# Patient Record
Sex: Female | Born: 1935 | Race: Black or African American | Hispanic: No | Smoking: Never smoker
Health system: Southern US, Community
[De-identification: ages and names within clinical notes are randomized; demographics above are authoritative.]

## PROBLEM LIST (undated history)

## (undated) DIAGNOSIS — E119 Type 2 diabetes mellitus without complications: Secondary | ICD-10-CM

## (undated) DIAGNOSIS — F0390 Unspecified dementia without behavioral disturbance: Secondary | ICD-10-CM

## (undated) DIAGNOSIS — N289 Disorder of kidney and ureter, unspecified: Secondary | ICD-10-CM

## (undated) DIAGNOSIS — I10 Essential (primary) hypertension: Secondary | ICD-10-CM

## (undated) DIAGNOSIS — M353 Polymyalgia rheumatica: Secondary | ICD-10-CM

## (undated) DIAGNOSIS — M1990 Unspecified osteoarthritis, unspecified site: Secondary | ICD-10-CM

## (undated) HISTORY — PX: KNEE SURGERY: SHX244

## (undated) HISTORY — PX: ABDOMINAL HYSTERECTOMY: SHX81

## (undated) HISTORY — PX: ROTATOR CUFF REPAIR: SHX139

---

## 2020-11-28 ENCOUNTER — Emergency Department (HOSPITAL_BASED_OUTPATIENT_CLINIC_OR_DEPARTMENT_OTHER): Payer: Medicare Other

## 2020-11-28 ENCOUNTER — Emergency Department (HOSPITAL_BASED_OUTPATIENT_CLINIC_OR_DEPARTMENT_OTHER)
Admission: EM | Admit: 2020-11-28 | Discharge: 2020-11-28 | Disposition: A | Discharge status: Home / Self Care | Payer: Medicare Other | Attending: Emergency Medicine | Admitting: Emergency Medicine

## 2020-11-28 ENCOUNTER — Other Ambulatory Visit: Payer: Self-pay

## 2020-11-28 ENCOUNTER — Encounter (HOSPITAL_BASED_OUTPATIENT_CLINIC_OR_DEPARTMENT_OTHER): Payer: Self-pay | Admitting: *Deleted

## 2020-11-28 DIAGNOSIS — I10 Essential (primary) hypertension: Secondary | ICD-10-CM | POA: Diagnosis not present

## 2020-11-28 DIAGNOSIS — Z79899 Other long term (current) drug therapy: Secondary | ICD-10-CM | POA: Insufficient documentation

## 2020-11-28 DIAGNOSIS — R4182 Altered mental status, unspecified: Secondary | ICD-10-CM | POA: Diagnosis present

## 2020-11-28 DIAGNOSIS — E11649 Type 2 diabetes mellitus with hypoglycemia without coma: Secondary | ICD-10-CM | POA: Insufficient documentation

## 2020-11-28 DIAGNOSIS — Z7982 Long term (current) use of aspirin: Secondary | ICD-10-CM | POA: Diagnosis not present

## 2020-11-28 DIAGNOSIS — R410 Disorientation, unspecified: Secondary | ICD-10-CM | POA: Diagnosis not present

## 2020-11-28 DIAGNOSIS — E162 Hypoglycemia, unspecified: Secondary | ICD-10-CM

## 2020-11-28 HISTORY — DX: Unspecified osteoarthritis, unspecified site: M19.90

## 2020-11-28 HISTORY — DX: Type 2 diabetes mellitus without complications: E11.9

## 2020-11-28 HISTORY — DX: Essential (primary) hypertension: I10

## 2020-11-28 LAB — CBC WITH DIFFERENTIAL/PLATELET
Abs Immature Granulocytes: 0.03 10*3/uL (ref 0.00–0.07)
Basophils Absolute: 0 10*3/uL (ref 0.0–0.1)
Basophils Relative: 0 %
Eosinophils Absolute: 0 10*3/uL (ref 0.0–0.5)
Eosinophils Relative: 0 %
HCT: 36.2 % (ref 36.0–46.0)
Hemoglobin: 11.7 g/dL — ABNORMAL LOW (ref 12.0–15.0)
Immature Granulocytes: 0 %
Lymphocytes Relative: 19 %
Lymphs Abs: 1.8 10*3/uL (ref 0.7–4.0)
MCH: 28.6 pg (ref 26.0–34.0)
MCHC: 32.3 g/dL (ref 30.0–36.0)
MCV: 88.5 fL (ref 80.0–100.0)
Monocytes Absolute: 0.6 10*3/uL (ref 0.1–1.0)
Monocytes Relative: 6 %
Neutro Abs: 6.9 10*3/uL (ref 1.7–7.7)
Neutrophils Relative %: 75 %
Platelets: 209 10*3/uL (ref 150–400)
RBC: 4.09 MIL/uL (ref 3.87–5.11)
RDW: 14.3 % (ref 11.5–15.5)
WBC: 9.5 10*3/uL (ref 4.0–10.5)
nRBC: 0 % (ref 0.0–0.2)

## 2020-11-28 LAB — COMPREHENSIVE METABOLIC PANEL
ALT: 23 U/L (ref 0–44)
AST: 25 U/L (ref 15–41)
Albumin: 4.4 g/dL (ref 3.5–5.0)
Alkaline Phosphatase: 47 U/L (ref 38–126)
Anion gap: 11 (ref 5–15)
BUN: 24 mg/dL — ABNORMAL HIGH (ref 8–23)
CO2: 28 mmol/L (ref 22–32)
Calcium: 9.4 mg/dL (ref 8.9–10.3)
Chloride: 101 mmol/L (ref 98–111)
Creatinine, Ser: 0.98 mg/dL (ref 0.44–1.00)
GFR, Estimated: 57 mL/min — ABNORMAL LOW (ref >60)
Glucose, Bld: 104 mg/dL — ABNORMAL HIGH (ref 70–99)
Potassium: 3.7 mmol/L (ref 3.5–5.1)
Sodium: 140 mmol/L (ref 135–145)
Total Bilirubin: 0.6 mg/dL (ref 0.3–1.2)
Total Protein: 6.9 g/dL (ref 6.5–8.1)

## 2020-11-28 LAB — ETHANOL: Alcohol, Ethyl (B): <10 mg/dL (ref <10)

## 2020-11-28 LAB — CBG MONITORING, ED: Glucose-Capillary: 64 mg/dL — ABNORMAL LOW (ref 70–99)

## 2020-11-28 NOTE — ED Triage Notes (Signed)
Confusion last night. This am she was back to normal. She is ambulatory. Alert and oriented on arrival to the ER.

## 2020-11-28 NOTE — ED Notes (Signed)
Patient transported to CT 

## 2020-11-28 NOTE — Discharge Instructions (Addendum)
You were seen in the emergency department for evaluation of an episode of confusion yesterday.  You were having no symptoms today and your lab work EKG chest x-ray and CAT scan of your head did not show any significant findings.  Your blood sugar was mildly low and improved with sugar.  Please try to eat 3 regular meals a day and keep well-hydrated.  Return to the emergency department for any worsening or concerning symptoms.  Follow-up with your primary care doctor.

## 2020-11-28 NOTE — ED Notes (Signed)
CBG 66 Butler MD at bedside and aware advises to give patient something to eat and drink patient provided with graham crackers and juice.

## 2020-11-28 NOTE — ED Provider Notes (Signed)
[facility] EMERGENCY DEPARTMENT Provider Note   CSN: [id number] Arrival date & time: 11/28/20  1131     History Chief Complaint  Patient presents with  . Altered Mental Status    Breanna Robinson is a 87 y.o. female.  She has a history of diabetes diet-controlled and hypertension.  Lives with daughter.  Daughter said last evening when she came home from work she thought her mother was confused.  She was reading the paper and talking about things that were not in the paper.  Daughter states some of it was gibberish.  There is no weakness or unsteadiness.  Daughter confronted her with this and it made the patient angry.  Today the patient is back to baseline.  They went to urgent care where they checked a urine and referred her here for further evaluation.  Patient denies any complaints and does not think that anything unusual happened yesterday.  No headache blurry vision double vision numbness weakness chest pain shortness of breath abdominal pain vomiting diarrhea or urinary symptoms.  No recent falls.  Daughter states she takes a lot of supplements medications but does not have any new prescription meds.  The history is provided by the patient and a relative.  Altered Mental Status Presenting symptoms: confusion   Severity:  Unable to specify Most recent episode:  Yesterday Episode history:  Single Progression:  Resolved Chronicity:  New Context: not head injury and not recent change in medication   Associated symptoms: no abdominal pain, normal movement, no difficulty breathing, no fever, no headaches, no nausea, no rash, no seizures, no slurred speech, no visual change and no vomiting        Past Medical History:  Diagnosis Date  . Arthritis   . Diabetes mellitus without complication (HCC)   . Hypertension     There are no problems to display for this patient.   Past Surgical History:  Procedure Laterality Date  . ABDOMINAL HYSTERECTOMY    . KNEE SURGERY    .  ROTATOR CUFF REPAIR       OB History   No obstetric history on file.     No family history on file.  Social History   Tobacco Use  . Smoking status: Never Smoker  . Smokeless tobacco: Never Used  Substance Use Topics  . Alcohol use: Never  . Drug use: Never    Home Medications Prior to Admission medications   Medication Sig Start Date End Date Taking? Authorizing Provider  alendronate (FOSAMAX) 70 MG tablet Take by mouth. 08/09/20  Yes [provider]  allopurinol (ZYLOPRIM) 100 MG tablet Take 1 tablet by mouth daily. 05/21/15  Yes [provider]  amLODipine (NORVASC) 10 MG tablet Take 1 tablet by mouth daily. 10/28/20  Yes [provider]  amLODipine (NORVASC) 5 MG tablet Take 1 tablet by mouth daily. 07/20/16  Yes [provider]  benazepril (LOTENSIN) 20 MG tablet Take 1 tablet by mouth daily. 01/19/17  Yes [provider]  Blood Glucose Monitoring Suppl (GLUCOCOM BLOOD GLUCOSE MONITOR) DEVI 1 each by Misc.(Non-Drug; Combo Route) route daily. 05/30/18  Yes [provider]  cyclobenzaprine (FLEXERIL) 10 MG tablet TAKE 1 TABLET BY MOUTH 3 (THREE) TIMES DAILY AS NEEDED FOR UP TO 10 DAYS FOR MUSCLE SPASMS. 11/28/18  Yes [provider]  diclofenac Sodium (VOLTAREN) 1 % GEL Apply to affected joints twice a day as needed 10/25/18  Yes [provider]  glucose blood (ONETOUCH VERIO) test strip USE WITH  GLUCOMETER ONCE DAILY TO CHECK BLOOD SUGARS 08/13/20  Yes [provider]  hydrochlorothiazide (HYDRODIURIL) 25 MG tablet Take 0.5 tablets by mouth daily. 12/08/16  Yes [provider]  prednisoLONE acetate (PRED FORTE) 1 % ophthalmic suspension PLACE 1 DROP INTO BOTH EYES 4 TIMES DAILY. 08/19/20  Yes [provider]  predniSONE (DELTASONE) 5 MG tablet Take by mouth. 10/27/18  Yes [provider]  simvastatin (ZOCOR) 10 MG tablet TAKE 1 TABLET BY MOUTH EVERY DAY AT NIGHT 03/01/17  Yes  [provider]  triamcinolone (KENALOG) 0.1 % paste APPLY AFTER MEALS AND AT BEDTIME 11/22/13  Yes [provider]  aspirin 81 MG chewable tablet Chew by mouth.    [provider]  potassium chloride (KLOR-CON) 10 MEQ tablet Take 1 tablet by mouth daily. 07/26/20   [provider]    Allergies    Patient has no known allergies.  Review of Systems   Review of Systems  Constitutional: Negative for fever.  HENT: Negative for sore throat.   Eyes: Negative for visual disturbance.  Respiratory: Negative for shortness of breath.   Cardiovascular: Negative for chest pain.  Gastrointestinal: Negative for abdominal pain, nausea and vomiting.  Genitourinary: Negative for dysuria.  Musculoskeletal: Negative for neck pain.  Skin: Negative for rash.  Neurological: Negative for seizures and headaches.  Psychiatric/Behavioral: Positive for confusion.    Physical Exam Updated Vital Signs BP (!) 178/64 (BP Location: Right Arm)   Pulse 61   Temp 97.9 F (36.6 C) (Oral)   Resp 16   Ht 5\' 2"  (1.575 m)   Wt 65.9 kg   SpO2 100%   BMI 26.56 kg/m   Physical Exam Vitals and nursing note reviewed.  Constitutional:      General: She is not in acute distress.    Appearance: Normal appearance. She is well-developed.  HENT:     Head: Normocephalic and atraumatic.  Eyes:     Conjunctiva/sclera: Conjunctivae normal.  Cardiovascular:     Rate and Rhythm: Normal rate and regular rhythm.     Heart sounds: No murmur heard.   Pulmonary:     Effort: Pulmonary effort is normal. No respiratory distress.     Breath sounds: Normal breath sounds.  Abdominal:     Palpations: Abdomen is soft.     Tenderness: There is no abdominal tenderness.  Musculoskeletal:        General: No deformity or signs of injury. Normal range of motion.     Cervical back: Neck supple.  Skin:    General: Skin is warm and dry.  Neurological:     General: No focal deficit present.      Mental Status: She is alert and oriented to person, place, and time.     Cranial Nerves: No cranial nerve deficit.     Sensory: No sensory deficit.     Motor: No weakness.     Coordination: Coordination normal.     Gait: Gait normal.     Comments: She is awake and alert.  She is following commands with all extremities.  Finger-to-nose intact.  Heel-to-shin intact.  Able to name objects.     ED Results / Procedures / Treatments   Labs (all labs ordered are listed, but only abnormal results are displayed) Labs Reviewed  CBC WITH DIFFERENTIAL/PLATELET - Abnormal; Notable for the following components:      Result Value   Hemoglobin 11.7 (*)    All other components within normal limits  COMPREHENSIVE METABOLIC  PANEL - Abnormal; Notable for the following components:   Glucose, Bld 104 (*)    BUN 24 (*)    GFR, Estimated 57 (*)    All other components within normal limits  CBG MONITORING, ED - Abnormal; Notable for the following components:   Glucose-Capillary 64 (*)    All other components within normal limits  ETHANOL    EKG EKG Interpretation  Date/Time:  Thursday November 28 2020 12:08:16 EDT Ventricular Rate:  61 PR Interval:    QRS Duration: 86 QT Interval:  432 QTC Calculation: 436 R Axis:   19 Text Interpretation: Sinus rhythm Ventricular premature complex Consider right atrial enlargement No old tracing to compare Confirmed by Meridee Score [phone]) on 11/28/2020 12:10:55 PM   Radiology CT Head Wo Contrast  Result Date: 11/28/2020 CLINICAL DATA:  Episode of confusion last night. The patient has since returned to baseline. EXAM: CT HEAD WITHOUT CONTRAST TECHNIQUE: Contiguous axial images were obtained from the base of the skull through the vertex without intravenous contrast. COMPARISON:  None. FINDINGS: Brain: No evidence of acute infarction, hemorrhage, hydrocephalus, extra-axial collection or mass lesion/mass effect. Cortical atrophy and chronic microvascular ischemic  change noted. Vascular: No hyperdense vessel or unexpected calcification. Skull: Intact.  No focal lesion. Sinuses/Orbits: Negative. Other: None. IMPRESSION: No acute abnormality. Electronically Signed   By: Drusilla Kanner M.D.   On: 11/28/2020 12:43   DG Chest Port 1 View  Result Date: 11/28/2020 CLINICAL DATA:  Confusion, altered mental status. EXAM: PORTABLE CHEST 1 VIEW COMPARISON:  August 31, 2019. FINDINGS: The heart size and mediastinal contours are within normal limits. Both lungs are clear. The visualized skeletal structures are unremarkable. IMPRESSION: No active disease. Aortic Atherosclerosis (ICD10-I70.0). Electronically Signed   By: Lupita Raider M.D.   On: 11/28/2020 12:40    Procedures Procedures   Medications Ordered in ED Medications - No data to display  ED Course  I have reviewed the triage vital signs and the nursing notes.  Pertinent labs & imaging results that were available during my care of the patient were reviewed by me and considered in my medical decision making (see chart for details).  Clinical Course as of 11/28/20 2042  Thu Nov 28, 2020  1209 Patient's blood sugar is somewhat low here at 67.  The nurse is going to give her some juice. [MB]  1244 Chest x-ray interpreted by me as no acute disease. [MB]  1328 Reviewed results with patient and daughter.  She has not given a urine sample yet but she had a urine done at the urgent care that was fairly unremarkable.  They do not feel like they need to wait on another one.  Unclear what precipitated this event yesterday.  She had some low blood sugar here and is not on any prescription medication but does take some supplements to help with her blood sugar.  Possibly had a low blood sugar event yesterday.  Counseled on patient and daughter to monitor for symptoms and follow-up with PCP.  Return instructions discussed [MB]    Clinical Course User Index [MB] Terrilee Files, MD   MDM Rules/Calculators/A&P                          Clinical Course as of 11/28/20 2042  Thu Nov 28, 2020  1209 Patient's blood sugar is somewhat low here at 67.  The nurse is going to give her some juice. [MB]  1244 Chest x-ray  interpreted by me as no acute disease. [MB]  1328 Reviewed results with patient and daughter.  She has not given a urine sample yet but she had a urine done at the urgent care that was fairly unremarkable.  They do not feel like they need to wait on another one.  Unclear what precipitated this event yesterday.  She had some low blood sugar here and is not on any prescription medication but does take some supplements to help with her blood sugar.  Possibly had a low blood sugar event yesterday.  Counseled on patient and daughter to monitor for symptoms and follow-up with PCP.  Return instructions discussed [MB]    Clinical Course User Index [MB] Terrilee FilesButler, Pinkney Venard C, MD   This patient complains of episode of confusion, low blood sugar; this involves an extensive number of treatment Options and is a complaint that carries with it a high risk of complications and Morbidity. The differential includes hypoglycemia, stroke, metabolic derangement, infection, arrhythmia  I ordered, reviewed and interpreted labs, which included CBC with normal white count, stable hemoglobin, chemistries fairly normal, LFTs normal, alcohol negative.  Fingerstick blood sugar low I ordered imaging studies which included chest x-ray and head CT and I independently    visualized and interpreted imaging which showed no acute findings Additional history obtained from patient's daughter Previous records obtained and reviewed in epic no recent admissions  After the interventions stated above, I reevaluated the patient and found patient to be asymptomatic here.  Reviewed work-up with her and her daughter.  They are comfortable plan with outpatient follow-up with the PCP.  Recommended close observation at home by daughter if another episode  comes to check her blood sugar.  If it is not low she should return to the emergency department for further evaluation.   Final Clinical Impression(s) / ED Diagnoses Final diagnoses:  Confusion  Hypoglycemia    Rx / DC Orders ED Discharge Orders    None       Terrilee FilesButler, Ezella Kell C, MD 11/28/20 2044

## 2022-11-27 ENCOUNTER — Emergency Department (HOSPITAL_BASED_OUTPATIENT_CLINIC_OR_DEPARTMENT_OTHER): Payer: Medicare Other

## 2022-11-27 ENCOUNTER — Other Ambulatory Visit: Payer: Self-pay

## 2022-11-27 ENCOUNTER — Emergency Department (HOSPITAL_BASED_OUTPATIENT_CLINIC_OR_DEPARTMENT_OTHER)
Admission: EM | Admit: 2022-11-27 | Discharge: 2022-11-27 | Disposition: A | Discharge status: Home / Self Care | Payer: Medicare Other | Attending: Emergency Medicine | Admitting: Emergency Medicine

## 2022-11-27 ENCOUNTER — Encounter (HOSPITAL_BASED_OUTPATIENT_CLINIC_OR_DEPARTMENT_OTHER): Payer: Self-pay | Admitting: Emergency Medicine

## 2022-11-27 DIAGNOSIS — M25552 Pain in left hip: Secondary | ICD-10-CM | POA: Diagnosis present

## 2022-11-27 DIAGNOSIS — M25522 Pain in left elbow: Secondary | ICD-10-CM | POA: Diagnosis not present

## 2022-11-27 DIAGNOSIS — W06XXXA Fall from bed, initial encounter: Secondary | ICD-10-CM | POA: Diagnosis not present

## 2022-11-27 DIAGNOSIS — S7002XA Contusion of left hip, initial encounter: Secondary | ICD-10-CM | POA: Diagnosis not present

## 2022-11-27 DIAGNOSIS — Y92003 Bedroom of unspecified non-institutional (private) residence as the place of occurrence of the external cause: Secondary | ICD-10-CM | POA: Insufficient documentation

## 2022-11-27 DIAGNOSIS — D649 Anemia, unspecified: Secondary | ICD-10-CM | POA: Diagnosis not present

## 2022-11-27 DIAGNOSIS — Z7982 Long term (current) use of aspirin: Secondary | ICD-10-CM | POA: Insufficient documentation

## 2022-11-27 DIAGNOSIS — M19012 Primary osteoarthritis, left shoulder: Secondary | ICD-10-CM | POA: Diagnosis not present

## 2022-11-27 DIAGNOSIS — H1132 Conjunctival hemorrhage, left eye: Secondary | ICD-10-CM | POA: Diagnosis not present

## 2022-11-27 DIAGNOSIS — M19019 Primary osteoarthritis, unspecified shoulder: Secondary | ICD-10-CM

## 2022-11-27 LAB — CBC
HCT: 29.4 % — ABNORMAL LOW (ref 36.0–46.0)
Hemoglobin: 9.6 g/dL — ABNORMAL LOW (ref 12.0–15.0)
MCH: 28.9 pg (ref 26.0–34.0)
MCHC: 32.7 g/dL (ref 30.0–36.0)
MCV: 88.6 fL (ref 80.0–100.0)
Platelets: 155 10*3/uL (ref 150–400)
RBC: 3.32 MIL/uL — ABNORMAL LOW (ref 3.87–5.11)
RDW: 14.7 % (ref 11.5–15.5)
WBC: 8.9 10*3/uL (ref 4.0–10.5)
nRBC: 0 % (ref 0.0–0.2)

## 2022-11-27 LAB — BASIC METABOLIC PANEL
Anion gap: 7 (ref 5–15)
BUN: 38 mg/dL — ABNORMAL HIGH (ref 8–23)
CO2: 26 mmol/L (ref 22–32)
Calcium: 9.4 mg/dL (ref 8.9–10.3)
Chloride: 104 mmol/L (ref 98–111)
Creatinine, Ser: 1.44 mg/dL — ABNORMAL HIGH (ref 0.44–1.00)
GFR, Estimated: 35 mL/min — ABNORMAL LOW (ref >60)
Glucose, Bld: 129 mg/dL — ABNORMAL HIGH (ref 70–99)
Potassium: 4.5 mmol/L (ref 3.5–5.1)
Sodium: 137 mmol/L (ref 135–145)

## 2022-11-27 MED ORDER — HYDROCODONE-ACETAMINOPHEN 5-325 MG PO TABS: 1.0000 | ORAL_TABLET | Freq: Four times a day (QID) | ORAL | 0 refills | Status: AC | PRN

## 2022-11-27 NOTE — ED Triage Notes (Signed)
Pt fell while getting out of the bed to go to the bathroom last night.  Pt states she injured her left side.  Pt unsure if she hit her head.  Daughter states she is bruised on her left side.

## 2022-11-27 NOTE — ED Provider Notes (Signed)
[place] [facility] Provider Note   CSN: [id number] Arrival date & time: 11/27/22  [id number]     History  Chief Complaint  Patient presents with   Fall    Breanna Robinson is a 89 y.o. female.   Fall   Patient presents to the ED for evaluation after fall.  Patient states she was getting out of her bed last night and ended up falling.  Patient denies feeling weak or lightheaded.  She denies a syncopal episode.  She has a stool that she use to help get herself out of the bed and she is not sure if she may be just stumbled on that or had not lowered the bed.  Patient did fall on her left side and is having pain in her shoulder and arm.  She also is having pain in her left hip area and noticed a large bruise.  Patient denies any headache.  No neck pain.  No numbness or weakness.  No chest pain or shortness of breath.  No fevers or chills.    Home Medications Prior to Admission medications   Medication Sig Start Date End Date Taking? Authorizing Provider  HYDROcodone-acetaminophen (NORCO/VICODIN) 5-325 MG tablet Take 1 tablet by mouth every 6 (six) hours as needed for severe pain. 11/27/22  Yes Dorie Rank, MD  alendronate (FOSAMAX) 70 MG tablet Take by mouth. 08/09/20   [provider]  allopurinol (ZYLOPRIM) 100 MG tablet Take 1 tablet by mouth daily. 05/21/15   [provider]  amLODipine (NORVASC) 10 MG tablet Take 1 tablet by mouth daily. 10/28/20   [provider]  amLODipine (NORVASC) 5 MG tablet Take 1 tablet by mouth daily. 07/20/16   [provider]  aspirin 81 MG chewable tablet Chew by mouth.    [provider]  benazepril (LOTENSIN) 20 MG tablet Take 1 tablet by mouth daily. 01/19/17   [provider]  Blood Glucose Monitoring Suppl (GLUCOCOM BLOOD GLUCOSE MONITOR) DEVI 1 each by [place].(Non-Drug; Combo Route) route daily. 05/30/18   [provider]  cyclobenzaprine (FLEXERIL) 10 MG tablet  TAKE 1 TABLET BY MOUTH 3 (THREE) TIMES DAILY AS NEEDED FOR UP TO 10 DAYS FOR MUSCLE SPASMS. 11/28/18   [provider]  diclofenac Sodium (VOLTAREN) 1 % GEL Apply to affected joints twice a day as needed 10/25/18   [provider]  glucose blood (ONETOUCH VERIO) test strip USE WITH GLUCOMETER ONCE DAILY TO CHECK BLOOD SUGARS 08/13/20   [provider]  hydrochlorothiazide (HYDRODIURIL) 25 MG tablet Take 0.5 tablets by mouth daily. 12/08/16   [provider]  potassium chloride (KLOR-CON) 10 MEQ tablet Take 1 tablet by mouth daily. 07/26/20   [provider]  prednisoLONE acetate (PRED FORTE) 1 % ophthalmic suspension PLACE 1 DROP INTO BOTH EYES 4 TIMES DAILY. 08/19/20   [provider]  predniSONE (DELTASONE) 5 MG tablet Take by mouth. 10/27/18   [provider]  simvastatin (ZOCOR) 10 MG tablet TAKE 1 TABLET BY MOUTH EVERY DAY AT NIGHT 03/01/17   [provider]  triamcinolone (KENALOG) 0.1 % paste APPLY AFTER MEALS AND AT BEDTIME 11/22/13   [provider]      Allergies    Patient has no known allergies.    Review of Systems   Review of Systems  Physical Exam Updated Vital Signs BP (!) 140/52 (BP Location: Left Arm)   Pulse 61   Temp 98.2 F (36.8 C) (Oral)   Resp  17   Ht 1.575 m (5\' 2" )   Wt 68.9 kg   SpO2 99%   BMI 27.80 kg/m  Physical Exam Vitals and nursing note reviewed.  Constitutional:      Appearance: She is well-developed. She is not diaphoretic.  HENT:     Head: Normocephalic and atraumatic.     Comments: No facial tenderness, no swelling    Right Ear: External ear normal.     Left Ear: External ear normal.  Eyes:     General: No scleral icterus.       Right eye: No discharge.        Left eye: No discharge.     Conjunctiva/sclera: Conjunctivae normal.     Comments: Small subconjunctival hemorrhage noted around the left eye  Neck:     Trachea: No tracheal deviation.  Cardiovascular:      Rate and Rhythm: Normal rate and regular rhythm.  Pulmonary:     Effort: Pulmonary effort is normal. No respiratory distress.     Breath sounds: Normal breath sounds. No stridor. No wheezing or rales.  Abdominal:     General: Bowel sounds are normal. There is no distension.     Palpations: Abdomen is soft.     Tenderness: There is no abdominal tenderness. There is no guarding or rebound.  Musculoskeletal:        General: No deformity.     Left upper arm: Tenderness present.     Left elbow: Tenderness present.     Cervical back: Normal and neck supple. No tenderness.     Thoracic back: Normal. No tenderness.     Lumbar back: Normal. No tenderness.     Left hip: Tenderness present.     Comments: Ecchymoses noted proximal humerus region in the left upper arm, mild crepitus noted with range of motion left elbow, no swelling or effusion, large ecchymoses palpable left upper thigh hip region, tenderness palpation;  Skin:    General: Skin is warm and dry.     Findings: No rash.  Neurological:     General: No focal deficit present.     Mental Status: She is alert.     Cranial Nerves: No cranial nerve deficit, dysarthria or facial asymmetry.     Sensory: No sensory deficit.     Motor: No abnormal muscle tone or seizure activity.     Coordination: Coordination normal.  Psychiatric:        Mood and Affect: Mood normal.     ED Results / Procedures / Treatments   Labs (all labs ordered are listed, but only abnormal results are displayed) Labs Reviewed  CBC - Abnormal; Notable for the following components:      Result Value   RBC 3.32 (*)    Hemoglobin 9.6 (*)    HCT 29.4 (*)    All other components within normal limits  BASIC METABOLIC PANEL - Abnormal; Notable for the following components:   Glucose, Bld 129 (*)    BUN 38 (*)    Creatinine, Ser 1.44 (*)    GFR, Estimated 35 (*)    All other components within normal limits    EKG EKG Interpretation  Date/Time:  Friday November 27 2022 11:47:45 EDT Ventricular Rate:  60 PR Interval:  130 QRS Duration: 81 QT Interval:  420 QTC Calculation: 420 R Axis:   -7 Text Interpretation: Sinus rhythm No significant change since last tracing Confirmed by Dorie Rank [phone] on 11/27/2022 11:52:15 AM  Radiology DG Chest  2 View  Result Date: 11/27/2022 CLINICAL DATA:  Fall. EXAM: CHEST - 2 VIEW COMPARISON:  Chest and left rib radiographs 04/29/2022, chest radiographs 11/28/2020 and 08/31/2019 FINDINGS: Cardiac silhouette and mediastinal contours are within normal limits. The lungs are clear. No pleural effusion or pneumothorax. Moderate multilevel disc space narrowing and anterior endplate osteophytes of the thoracic spine. IMPRESSION: No active cardiopulmonary disease. Electronically Signed   By: Yvonne Kendall M.D.   On: 11/27/2022 10:25   DG Elbow Complete Left  Result Date: 11/27/2022 CLINICAL DATA:  Fall.  Pain.  Injured left side. EXAM: LEFT ELBOW - COMPLETE 3+ VIEW COMPARISON:  None Available. FINDINGS: Mildly decreased bone mineralization. Moderate lateral and mild medial epicondyle curvilinear mineralization suggesting chronic enthesopathic changes at the common extensor greater than common flexor tendon origins, respectively. Mild curvilinear calcification overlying the distal triceps tendon insertion on the olecranon. Minimal overlying posterior elbow soft tissue swelling at the olecranon fossa. No acute fracture is seen. No dislocation. No elbow joint effusion. IMPRESSION: 1. No acute fracture. 2. Chronic enthesopathic changes at the common extensor greater than common flexor tendon origins. 3. Mild curvilinear calcification overlying the distal triceps tendon insertion on the olecranon. Electronically Signed   By: Yvonne Kendall M.D.   On: 11/27/2022 10:23   DG Hip Unilat W or Wo Pelvis 2-3 Views Left  Result Date: 11/27/2022 CLINICAL DATA:  Fall.  Pain.  Left-sided injury. EXAM: DG HIP (WITH OR WITHOUT PELVIS) 2-3V LEFT  COMPARISON:  None Available. FINDINGS: There is diffuse decreased bone mineralization. Moderate bilateral superomedial femoroacetabular joint space narrowing. Moderate left femoral head-neck junction circumferential degenerative osteophytes. Moderate overlying vascular calcifications overlying the left hip. Within these limitations, no definitive acute fracture is seen. Mild bilateral sacroiliac subchondral sclerosis. Mild pubic symphysis joint space narrowing. Numerous vascular phleboliths overlie the pelvis. IMPRESSION: 1. Moderate left femoroacetabular osteoarthritis. 2. Within the limitation of diffuse decreased bone mineralization, no acute fracture is seen within the left hip. Electronically Signed   By: Yvonne Kendall M.D.   On: 11/27/2022 10:18   DG Shoulder Left  Result Date: 11/27/2022 CLINICAL DATA:  Fall.  Pain. EXAM: LEFT SHOULDER - 2+ VIEW COMPARISON:  None Available. FINDINGS: The humeral head is high-riding and nearly contacts the undersurface of the acromion, suggesting a full-thickness superior rotator cuff tear. Moderate talonavicular joint space narrowing and peripheral osteophytosis. Moderate distal lateral subacromial spurring with a chronic 8 mm ossicle just lateral and inferior to the acromion. Mild cortical irregularity at the adjacent greater tuberosity, likely from chronic subacromial impingement. Mild glenohumeral joint space narrowing. No acute fracture is seen. No dislocation. IMPRESSION: 1. The humeral head is high-riding and nearly contacts the undersurface of the acromion, suggesting a full-thickness superior rotator cuff tear. 2. Moderate acromioclavicular and mild glenohumeral osteoarthritis. Electronically Signed   By: Yvonne Kendall M.D.   On: 11/27/2022 10:16    Procedures Procedures    Medications Ordered in ED Medications - No data to display  ED Course/ Medical Decision Making/ A&P Clinical Course as of 11/27/22 1320  Fri Nov 27, 2022  1108 Order x-ray shows  findings of osteoarthritis and probable rotator cuff tear.  No acute fractures noted on chest x-ray or elbow x-ray.  Hip without acute fracture although limited by osteoporosis [JK]  1205 Hemoglobin decreased at 9.6, down from 11.6 [JK]  0000000 Basic metabolic panel(!) [JK]  0000000 Creatinine elevated compared to 1 year ago. [JK]  1248 2 months ago creatinine is 1.31. [JK]  Clinical Course User Index [JK] Dorie Rank, MD                             Medical Decision Making Problems Addressed: Anemia, unspecified type: acute illness or injury Arthritis, shoulder region: acute illness or injury Hematoma of left hip, initial encounter: acute illness or injury  Amount and/or Complexity of Data Reviewed Labs: ordered. Decision-making details documented in ED Course. Radiology: ordered and independent interpretation performed.  Risk Prescription drug management.   Patient presented to the ED for evaluation after fall.  Presentation not suggestive of syncope and it sounds like a mechanical type fall.  Patient has been able to bear weight since that time.  The episode occurred yesterday.  Patient's x-rays do not show any signs of acute fracture.  I doubt occult hip fracture as the patient has been able to bear weight.  She does have significant hematoma on her left thigh.  Her hemoglobin is decreased compared to recent values and I suspect this is related to the hematoma.  She does not have any signs of any active bleeding.  There is no indications for transfusion.  Patient is hemodynamically stable.  Will have her follow-up with primary care doctor and orthopedic doctor regarding her shoulder discomfort and hip discomfort.  Discussed use of Tylenol for pain and she can take the hydrocodone for more severe pain.  Explained to the patient and her daughter to be cautious with hydrocodone as it can cause problems with constipation dizziness lightheadedness nausea.        Final Clinical  Impression(s) / ED Diagnoses Final diagnoses:  Hematoma of left hip, initial encounter  Anemia, unspecified type  Arthritis, shoulder region    Rx / DC Orders ED Discharge Orders          Ordered    HYDROcodone-acetaminophen (NORCO/VICODIN) 5-325 MG tablet  Every 6 hours PRN        11/27/22 1318              Dorie Rank, MD 11/27/22 1321

## 2022-11-27 NOTE — Discharge Instructions (Addendum)
Take over-the-counter Tylenol to help with the pain and discomfort in your shoulder and hip related to your fall.  Reserve the hydrocodone for more severe pain.  It can cause trouble with constipation as well as nausea.  Follow-up with your primary care doctor or an orthopedic doctor to be rechecked if the symptoms do not improve.

## 2023-01-17 IMAGING — CT CT HEAD W/O CM
3 series · 16 of 47 positions shown, 19 images · non-contrast
Comparison: None.

CLINICAL DATA: Episode of confusion last night. The patient has
since returned to baseline.

EXAM:
CT HEAD WITHOUT CONTRAST
TECHNIQUE: Contiguous axial images were obtained from the base of the skull
through the vertex without intravenous contrast.

[Series 2: head wo · axial · 0.42mm/px · z∈[-133,-8]mm · 10 of 30 slices shown, 13 images]
[im 3/30  brain]
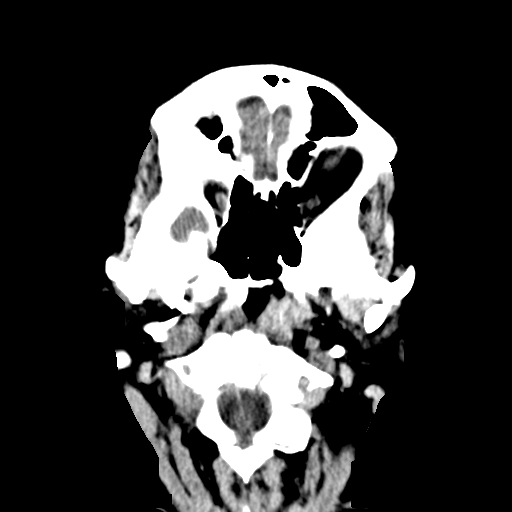
[im 3/30  bone]
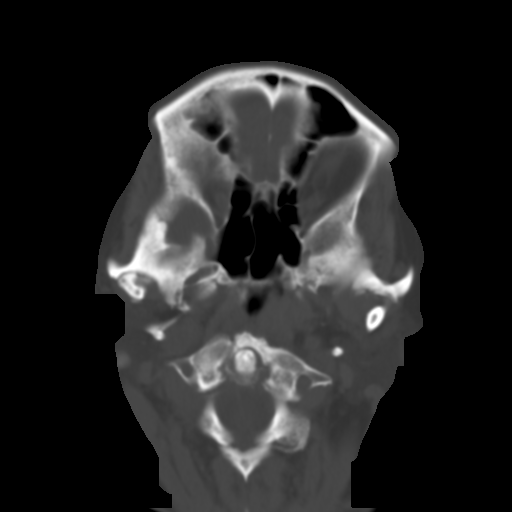
[im 6/30  brain]
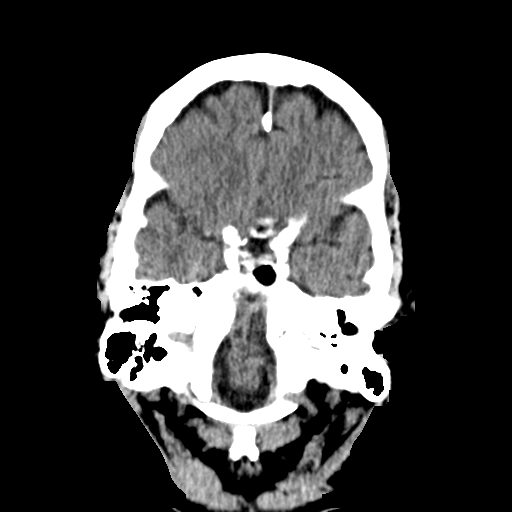
[im 9/30  brain]
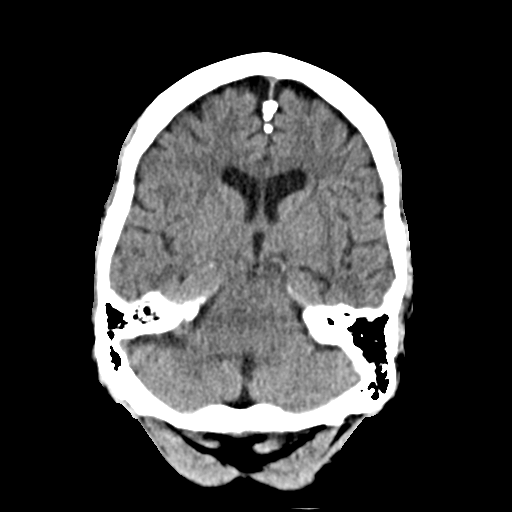
[im 11/30  brain]
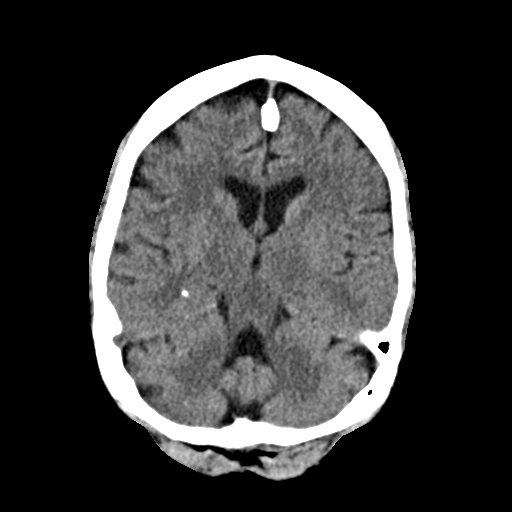
[im 14/30  brain]
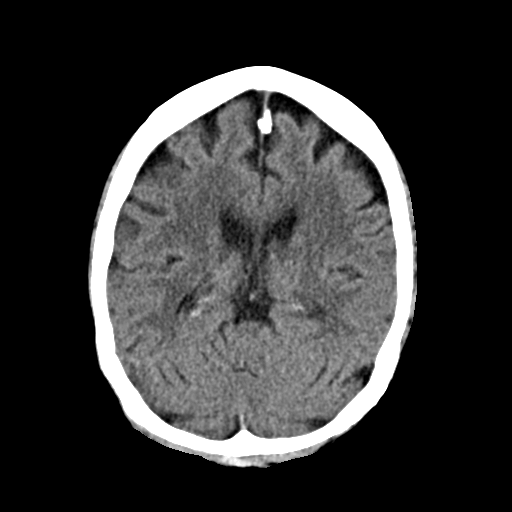
[im 14/30  bone]
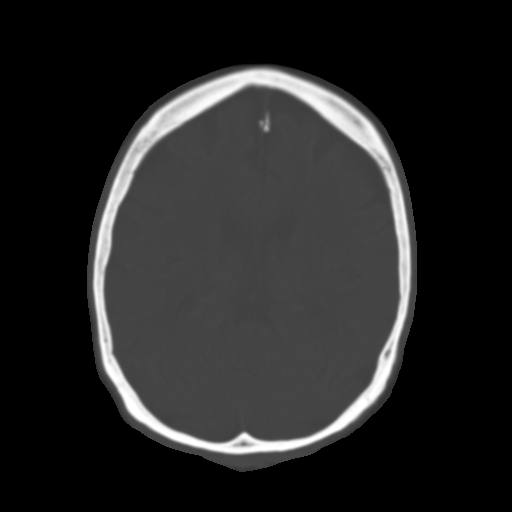
[im 17/30  brain]
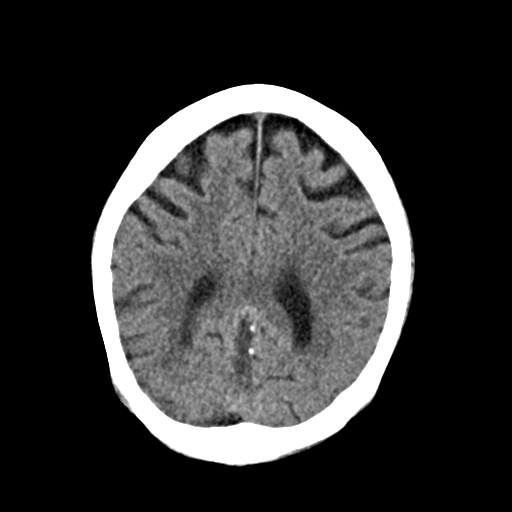
[im 20/30  brain]
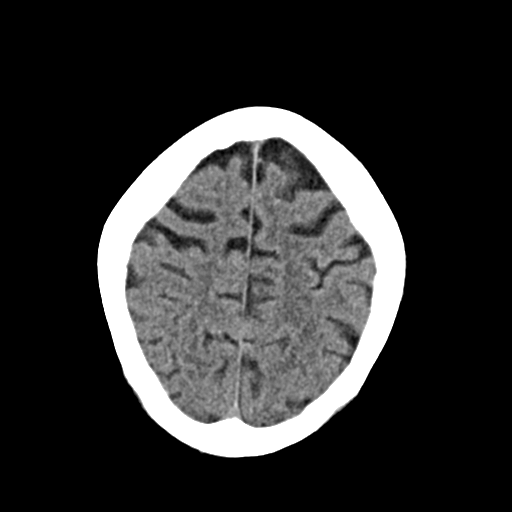
[im 23/30  brain]
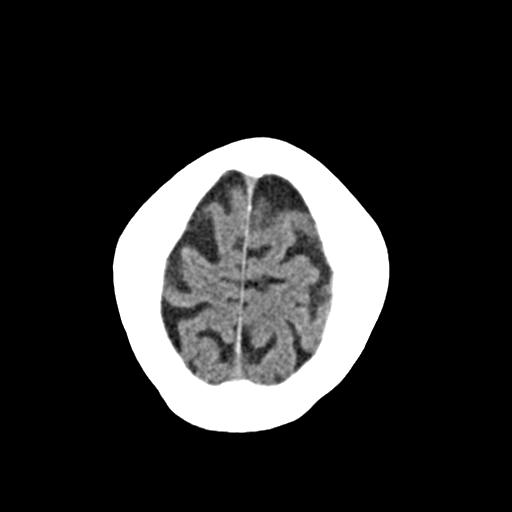
[im 25/30  brain]
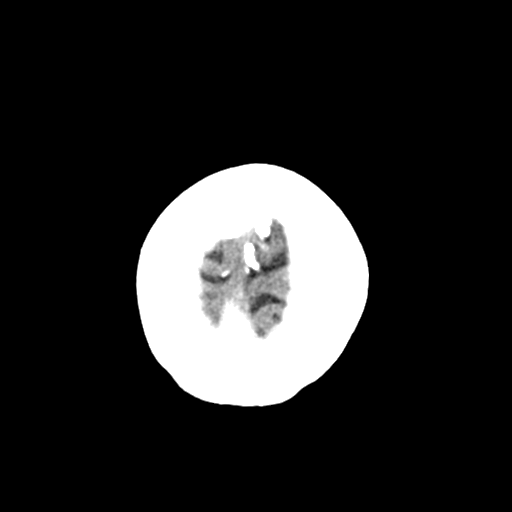
[im 25/30  bone]
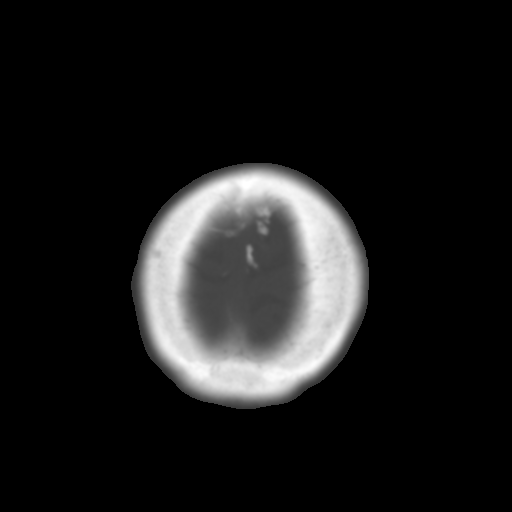
[im 28/30  brain]
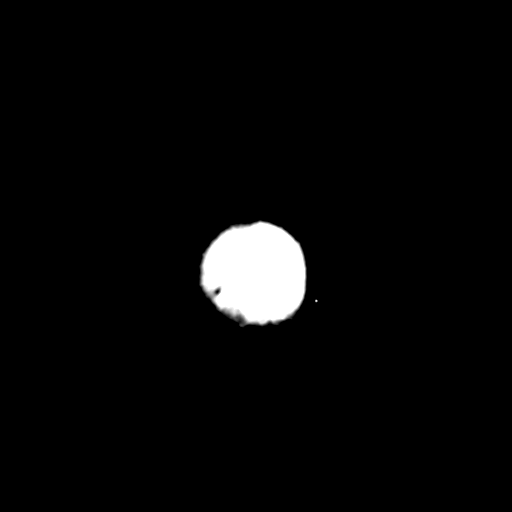

[Series 4: coronal soft · coronal · 0.32mm/px · 3 of 61 slices shown]
[im 21/61  brain]
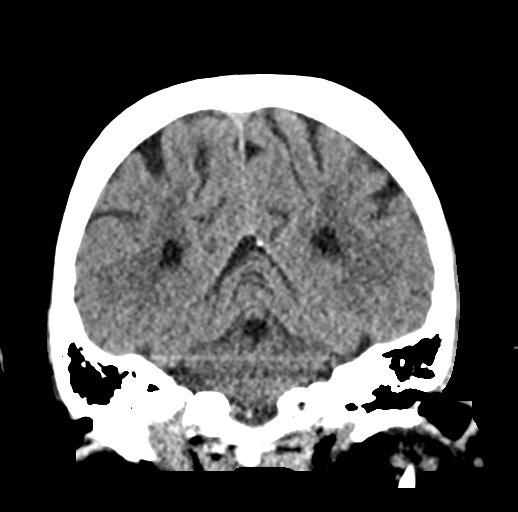
[im 27/61  brain]
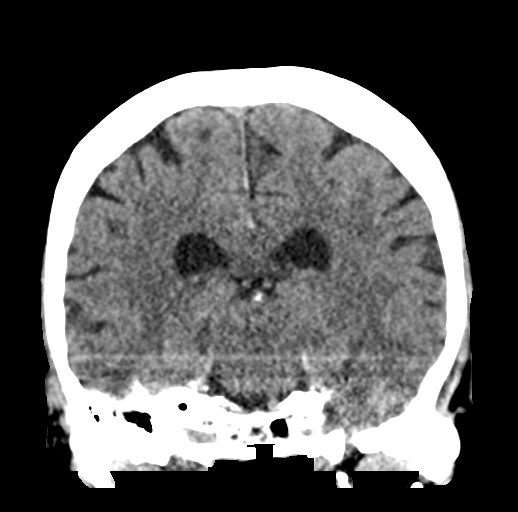
[im 34/61  brain]
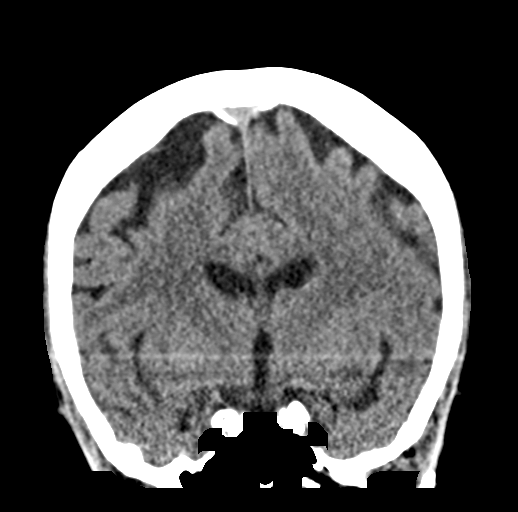

[Series 5: sag soft · sagittal · 0.31mm/px · 3 of 52 slices shown]
[im 18/52  brain]
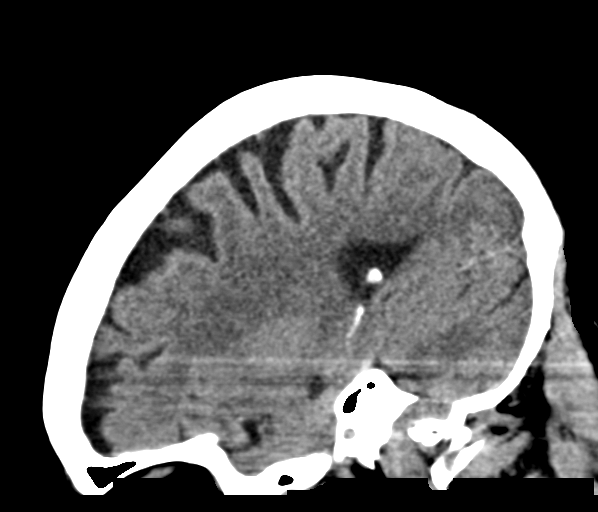
[im 26/52  brain]
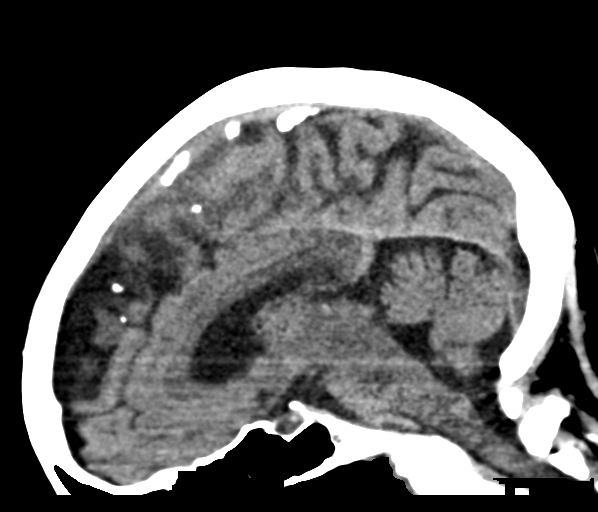
[im 35/52  brain]
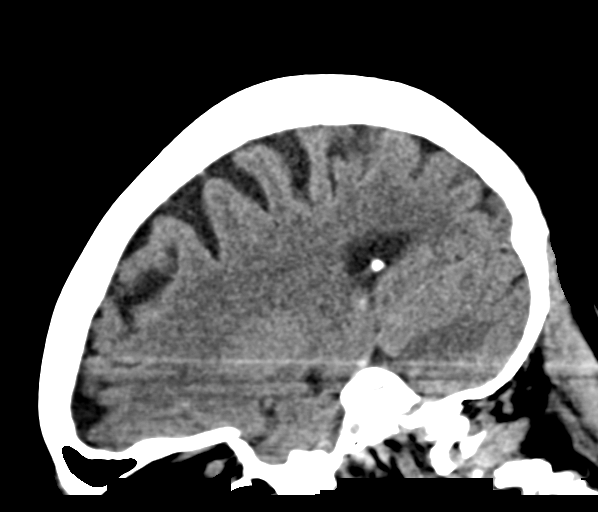

[16 of 47 positions shown; findings below may reference images not displayed]

FINDINGS: Brain: No evidence of acute infarction, hemorrhage, hydrocephalus,
extra-axial collection or mass lesion/mass effect. Cortical atrophy
and chronic microvascular ischemic change noted.

Vascular: No hyperdense vessel or unexpected calcification.

Skull: Intact.  No focal lesion.

Sinuses/Orbits: Negative.

Other: None.
IMPRESSION: No acute abnormality.

## 2023-08-02 ENCOUNTER — Other Ambulatory Visit (HOSPITAL_BASED_OUTPATIENT_CLINIC_OR_DEPARTMENT_OTHER): Payer: Self-pay

## 2023-08-02 ENCOUNTER — Emergency Department (HOSPITAL_BASED_OUTPATIENT_CLINIC_OR_DEPARTMENT_OTHER): Payer: Medicare Other

## 2023-08-02 ENCOUNTER — Encounter (HOSPITAL_BASED_OUTPATIENT_CLINIC_OR_DEPARTMENT_OTHER): Payer: Self-pay | Admitting: Emergency Medicine

## 2023-08-02 ENCOUNTER — Emergency Department (HOSPITAL_BASED_OUTPATIENT_CLINIC_OR_DEPARTMENT_OTHER)
Admission: EM | Admit: 2023-08-02 | Discharge: 2023-08-02 | Disposition: A | Discharge status: Home / Self Care | Payer: Medicare Other | Attending: Emergency Medicine | Admitting: Emergency Medicine

## 2023-08-02 ENCOUNTER — Other Ambulatory Visit: Payer: Self-pay

## 2023-08-02 DIAGNOSIS — R0789 Other chest pain: Secondary | ICD-10-CM | POA: Diagnosis present

## 2023-08-02 DIAGNOSIS — R079 Chest pain, unspecified: Secondary | ICD-10-CM

## 2023-08-02 DIAGNOSIS — I10 Essential (primary) hypertension: Secondary | ICD-10-CM | POA: Diagnosis not present

## 2023-08-02 DIAGNOSIS — Z79899 Other long term (current) drug therapy: Secondary | ICD-10-CM | POA: Insufficient documentation

## 2023-08-02 DIAGNOSIS — E119 Type 2 diabetes mellitus without complications: Secondary | ICD-10-CM | POA: Diagnosis not present

## 2023-08-02 DIAGNOSIS — Z7982 Long term (current) use of aspirin: Secondary | ICD-10-CM | POA: Diagnosis not present

## 2023-08-02 HISTORY — DX: Polymyalgia rheumatica: M35.3

## 2023-08-02 LAB — BASIC METABOLIC PANEL
Anion gap: 9 (ref 5–15)
BUN: 26 mg/dL — ABNORMAL HIGH (ref 8–23)
CO2: 25 mmol/L (ref 22–32)
Calcium: 9.4 mg/dL (ref 8.9–10.3)
Chloride: 106 mmol/L (ref 98–111)
Creatinine, Ser: 1.21 mg/dL — ABNORMAL HIGH (ref 0.44–1.00)
GFR, Estimated: 43 mL/min — ABNORMAL LOW (ref >60)
Glucose, Bld: 113 mg/dL — ABNORMAL HIGH (ref 70–99)
Potassium: 3.7 mmol/L (ref 3.5–5.1)
Sodium: 140 mmol/L (ref 135–145)

## 2023-08-02 LAB — CBC
HCT: 36.4 % (ref 36.0–46.0)
Hemoglobin: 11.5 g/dL — ABNORMAL LOW (ref 12.0–15.0)
MCH: 27.4 pg (ref 26.0–34.0)
MCHC: 31.6 g/dL (ref 30.0–36.0)
MCV: 86.9 fL (ref 80.0–100.0)
Platelets: 182 10*3/uL (ref 150–400)
RBC: 4.19 MIL/uL (ref 3.87–5.11)
RDW: 16.2 % — ABNORMAL HIGH (ref 11.5–15.5)
WBC: 11.7 10*3/uL — ABNORMAL HIGH (ref 4.0–10.5)
nRBC: 0 % (ref 0.0–0.2)

## 2023-08-02 LAB — TROPONIN I (HIGH SENSITIVITY): Troponin I (High Sensitivity): 9 ng/L (ref <18)

## 2023-08-02 MED ORDER — KETOROLAC TROMETHAMINE 15 MG/ML IJ SOLN
15.0000 mg | Freq: Once | INTRAMUSCULAR | Status: AC
  Administered 2023-08-02: 15 mg via INTRAVENOUS
  Filled 2023-08-02: qty 1

## 2023-08-02 MED ORDER — METHOCARBAMOL 500 MG PO TABS
250.0000 mg | ORAL_TABLET | Freq: Three times a day (TID) | ORAL | 0 refills | Status: AC | PRN
Start: 2023-08-02 — End: ?
  Filled 2023-08-02: qty 8, 5d supply, fill #0

## 2023-08-02 NOTE — ED Provider Notes (Signed)
[place] [facility] Provider Note   CSN: [id number] Arrival date & time: 08/02/23  3474     History  Chief Complaint  Patient presents with   Chest Pain    Breanna Robinson is a 89 y.o. female.   Chest Pain  Patient present with chest pain.  Right side.  Is had since Friday with today being Monday.  Has been constant.  Not worse with breathing.  Worse with certain movements.  No fevers or chills.  No coughing.  No swelling in her legs.   Past Medical History:  Diagnosis Date   Arthritis    Diabetes mellitus without complication (HCC)    Hypertension    Polymyalgia rheumatica (HCC)     Home Medications Prior to Admission medications   Medication Sig Start Date End Date Taking? Authorizing Provider  methocarbamol (ROBAXIN) 500 MG tablet Take 0.5 tablets (250 mg total) by mouth every 8 (eight) hours as needed for muscle spasms. 08/02/23  Yes Benjiman Core, MD  alendronate (FOSAMAX) 70 MG tablet Take by mouth. 08/09/20   [provider]  allopurinol (ZYLOPRIM) 100 MG tablet Take 1 tablet by mouth daily. 05/21/15   [provider]  amLODipine (NORVASC) 10 MG tablet Take 1 tablet by mouth daily. 10/28/20   [provider]  amLODipine (NORVASC) 5 MG tablet Take 1 tablet by mouth daily. 07/20/16   [provider]  aspirin 81 MG chewable tablet Chew by mouth.    [provider]  benazepril (LOTENSIN) 20 MG tablet Take 1 tablet by mouth daily. 01/19/17   [provider]  Blood Glucose Monitoring Suppl (GLUCOCOM BLOOD GLUCOSE MONITOR) DEVI 1 each by [place].(Non-Drug; Combo Route) route daily. 05/30/18   [provider]  diclofenac Sodium (VOLTAREN) 1 % GEL Apply to affected joints twice a day as needed 10/25/18   [provider]  glucose blood (ONETOUCH VERIO) test strip USE WITH GLUCOMETER ONCE DAILY TO CHECK BLOOD SUGARS 08/13/20   [provider]  hydrochlorothiazide  (HYDRODIURIL) 25 MG tablet Take 0.5 tablets by mouth daily. 12/08/16   [provider]  HYDROcodone-acetaminophen (NORCO/VICODIN) 5-325 MG tablet Take 1 tablet by mouth every 6 (six) hours as needed for severe pain. 11/27/22   Linwood Dibbles, MD  potassium chloride (KLOR-CON) 10 MEQ tablet Take 1 tablet by mouth daily. 07/26/20   [provider]  prednisoLONE acetate (PRED FORTE) 1 % ophthalmic suspension PLACE 1 DROP INTO BOTH EYES 4 TIMES DAILY. 08/19/20   [provider]  predniSONE (DELTASONE) 5 MG tablet Take by mouth. 10/27/18   [provider]  simvastatin (ZOCOR) 10 MG tablet TAKE 1 TABLET BY MOUTH EVERY DAY AT NIGHT 03/01/17   [provider]  triamcinolone (KENALOG) 0.1 % paste APPLY AFTER MEALS AND AT BEDTIME 11/22/13   [provider]      Allergies    Patient has no known allergies.    Review of Systems   Review of Systems  Cardiovascular:  Positive for chest pain.    Physical Exam Updated Vital Signs BP (!) 148/49   Pulse 65   Temp 98.9 F (37.2 C)   Resp 19   Ht 5\' 2"  (1.575 m)   Wt 60.3 kg   SpO2 96%   BMI 24.33 kg/m  Physical Exam Vitals and nursing note reviewed.  Cardiovascular:     Rate and Rhythm: Regular rhythm.  Pulmonary:     Breath sounds: No wheezing.  Chest:  Chest wall: Tenderness present.     Comments: Tenderness right lateral chest wall.  No crepitance.  No deformity.  Does have some point tenderness without rash. Musculoskeletal:     Right lower leg: No tenderness.     Left lower leg: No tenderness.  Skin:    Capillary Refill: Capillary refill takes less than 2 seconds.  Neurological:     Mental Status: She is alert.     ED Results / Procedures / Treatments   Labs (all labs ordered are listed, but only abnormal results are displayed) Labs Reviewed  BASIC METABOLIC PANEL - Abnormal; Notable for the following components:      Result Value   Glucose, Bld 113 (*)    BUN 26 (*)     Creatinine, Ser 1.21 (*)    GFR, Estimated 43 (*)    All other components within normal limits  CBC - Abnormal; Notable for the following components:   WBC 11.7 (*)    Hemoglobin 11.5 (*)    RDW 16.2 (*)    All other components within normal limits  TROPONIN I (HIGH SENSITIVITY)    EKG EKG Interpretation Date/Time:  Monday August 02 2023 09:33:13 EST Ventricular Rate:  69 PR Interval:  127 QRS Duration:  81 QT Interval:  397 QTC Calculation: 426 R Axis:   12  Text Interpretation: duplicate Confirmed by Benjiman Core [phone]) on 08/02/2023 9:49:24 AM  Radiology DG Chest 2 View  Result Date: 08/02/2023 CLINICAL DATA:  Chest pain since Friday EXAM: CHEST - 2 VIEW COMPARISON:  01/07/2023 from [facility] FINDINGS: Numerous leads and wires project over the chest. Midline trachea. Normal heart size. Atherosclerosis in the transverse aorta. No pleural effusion or pneumothorax. Clear lungs. IMPRESSION: No acute cardiopulmonary disease. Aortic Atherosclerosis (ICD10-I70.0). Electronically Signed   By: Jeronimo Greaves M.D.   On: 08/02/2023 12:48    Procedures Procedures    Medications Ordered in ED Medications  ketorolac (TORADOL) 15 MG/ML injection 15 mg (15 mg Intravenous Given 08/02/23 1002)    ED Course/ Medical Decision Making/ A&P                                 Medical Decision Making Amount and/or Complexity of Data Reviewed Labs: ordered. Radiology: ordered.  Risk Prescription drug management.   Patient with right-sided chest pain.  Has had for now 3 days.  Differential diagnosis includes cardiac cause, pneumonia, musculoskeletal pain, shingles.  Pulmonary medicine felt less likely.  EKG reassuring.  Will get x-ray and basic blood work and treat symptomatically.  X-ray reassuring.  Troponins negative.  Has had pain since Friday and do not think we need delta troponin.  Potentially chest wall pain.  Feels better after Toradol.  Will discharge home with  small dose of muscle relaxer.  Follow-up with PCP as needed.       Final Clinical Impression(s) / ED Diagnoses Final diagnoses:  Nonspecific chest pain    Rx / DC Orders ED Discharge Orders          Ordered    methocarbamol (ROBAXIN) 500 MG tablet  Every 8 hours PRN        08/02/23 1259              Benjiman Core, MD 08/02/23 1448

## 2023-08-02 NOTE — ED Notes (Signed)
 Fall risk armband Fall risk sign Patient wearing shoes

## 2023-08-02 NOTE — ED Notes (Signed)
Pt alert and oriented X 4 at the time of discharge. RR even and unlabored. No acute distress noted. Pt verbalized understanding of discharge instructions as discussed. Pt ambulatory to lobby at time of discharge.

## 2023-08-02 NOTE — ED Notes (Signed)
Fall risk armband Fall risk sign on door Patient wearing shoes

## 2023-08-02 NOTE — ED Triage Notes (Signed)
Right sided chest/breast pain since Friday.  No known injury.  Pt denies heavy lifting or pulling her right arm.  Pt also admits to left leg pain which is chronic.  No recent travel or hormone use.  Denies sob or fever.

## 2024-03-23 ENCOUNTER — Other Ambulatory Visit: Payer: Self-pay

## 2024-03-23 ENCOUNTER — Telehealth (HOSPITAL_BASED_OUTPATIENT_CLINIC_OR_DEPARTMENT_OTHER): Payer: Self-pay | Admitting: Emergency Medicine

## 2024-03-23 ENCOUNTER — Emergency Department (HOSPITAL_BASED_OUTPATIENT_CLINIC_OR_DEPARTMENT_OTHER)
Admission: EM | Admit: 2024-03-23 | Discharge: 2024-03-23 | Disposition: A | Discharge status: Home / Self Care | Attending: Emergency Medicine | Admitting: Emergency Medicine

## 2024-03-23 ENCOUNTER — Encounter (HOSPITAL_BASED_OUTPATIENT_CLINIC_OR_DEPARTMENT_OTHER): Payer: Self-pay

## 2024-03-23 ENCOUNTER — Emergency Department (HOSPITAL_BASED_OUTPATIENT_CLINIC_OR_DEPARTMENT_OTHER)

## 2024-03-23 DIAGNOSIS — E1122 Type 2 diabetes mellitus with diabetic chronic kidney disease: Secondary | ICD-10-CM | POA: Diagnosis not present

## 2024-03-23 DIAGNOSIS — R4182 Altered mental status, unspecified: Secondary | ICD-10-CM | POA: Diagnosis present

## 2024-03-23 DIAGNOSIS — F05 Delirium due to known physiological condition: Secondary | ICD-10-CM | POA: Diagnosis not present

## 2024-03-23 DIAGNOSIS — E1165 Type 2 diabetes mellitus with hyperglycemia: Secondary | ICD-10-CM | POA: Diagnosis not present

## 2024-03-23 DIAGNOSIS — Z7982 Long term (current) use of aspirin: Secondary | ICD-10-CM | POA: Diagnosis not present

## 2024-03-23 DIAGNOSIS — R011 Cardiac murmur, unspecified: Secondary | ICD-10-CM | POA: Insufficient documentation

## 2024-03-23 DIAGNOSIS — I129 Hypertensive chronic kidney disease with stage 1 through stage 4 chronic kidney disease, or unspecified chronic kidney disease: Secondary | ICD-10-CM | POA: Diagnosis not present

## 2024-03-23 DIAGNOSIS — N390 Urinary tract infection, site not specified: Secondary | ICD-10-CM | POA: Diagnosis not present

## 2024-03-23 DIAGNOSIS — R410 Disorientation, unspecified: Secondary | ICD-10-CM

## 2024-03-23 DIAGNOSIS — R600 Localized edema: Secondary | ICD-10-CM | POA: Insufficient documentation

## 2024-03-23 DIAGNOSIS — Z79899 Other long term (current) drug therapy: Secondary | ICD-10-CM | POA: Diagnosis not present

## 2024-03-23 DIAGNOSIS — D72829 Elevated white blood cell count, unspecified: Secondary | ICD-10-CM | POA: Insufficient documentation

## 2024-03-23 DIAGNOSIS — N189 Chronic kidney disease, unspecified: Secondary | ICD-10-CM | POA: Insufficient documentation

## 2024-03-23 LAB — COMPREHENSIVE METABOLIC PANEL WITH GFR
ALT: 41 U/L (ref 0–44)
AST: 36 U/L (ref 15–41)
Albumin: 4.8 g/dL (ref 3.5–5.0)
Alkaline Phosphatase: 92 U/L (ref 38–126)
Anion gap: 16 — ABNORMAL HIGH (ref 5–15)
BUN: 26 mg/dL — ABNORMAL HIGH (ref 8–23)
CO2: 27 mmol/L (ref 22–32)
Calcium: 9.9 mg/dL (ref 8.9–10.3)
Chloride: 101 mmol/L (ref 98–111)
Creatinine, Ser: 1.26 mg/dL — ABNORMAL HIGH (ref 0.44–1.00)
GFR, Estimated: 41 mL/min — ABNORMAL LOW (ref >60)
Glucose, Bld: 166 mg/dL — ABNORMAL HIGH (ref 70–99)
Potassium: 4.2 mmol/L (ref 3.5–5.1)
Sodium: 143 mmol/L (ref 135–145)
Total Bilirubin: 0.8 mg/dL (ref 0.0–1.2)
Total Protein: 7.3 g/dL (ref 6.5–8.1)

## 2024-03-23 LAB — URINALYSIS, ROUTINE W REFLEX MICROSCOPIC
Bilirubin Urine: NEGATIVE
Glucose, UA: NEGATIVE mg/dL
Hgb urine dipstick: NEGATIVE
Ketones, ur: NEGATIVE mg/dL
Nitrite: NEGATIVE
Protein, ur: 100 mg/dL — AB
Specific Gravity, Urine: 1.02 (ref 1.005–1.030)
pH: 6 (ref 5.0–8.0)

## 2024-03-23 LAB — CBC
HCT: 36.4 % (ref 36.0–46.0)
Hemoglobin: 11.7 g/dL — ABNORMAL LOW (ref 12.0–15.0)
MCH: 28.1 pg (ref 26.0–34.0)
MCHC: 32.1 g/dL (ref 30.0–36.0)
MCV: 87.3 fL (ref 80.0–100.0)
Platelets: 176 K/uL (ref 150–400)
RBC: 4.17 MIL/uL (ref 3.87–5.11)
RDW: 15.9 % — ABNORMAL HIGH (ref 11.5–15.5)
WBC: 11.6 K/uL — ABNORMAL HIGH (ref 4.0–10.5)
nRBC: 0 % (ref 0.0–0.2)

## 2024-03-23 LAB — CBG MONITORING, ED: Glucose-Capillary: 152 mg/dL — ABNORMAL HIGH (ref 70–99)

## 2024-03-23 LAB — URINALYSIS, MICROSCOPIC (REFLEX)
RBC / HPF: NONE SEEN RBC/hpf (ref 0–5)
WBC, UA: >50 WBC/hpf (ref 0–5)

## 2024-03-23 MED ORDER — CEPHALEXIN 500 MG PO CAPS: 500.0000 mg | ORAL_CAPSULE | Freq: Two times a day (BID) | ORAL | 0 refills | Status: AC

## 2024-03-23 MED ORDER — CEPHALEXIN 500 MG PO CAPS: 500.0000 mg | ORAL_CAPSULE | Freq: Two times a day (BID) | ORAL | 0 refills | Status: DC

## 2024-03-23 MED ORDER — LACTATED RINGERS IV BOLUS: 250.0000 mL | Freq: Once | INTRAVENOUS | Status: DC

## 2024-03-23 NOTE — Telephone Encounter (Signed)
 Antibiotic prescription had to be resent because the pharmacy did not receive it

## 2024-03-23 NOTE — ED Triage Notes (Signed)
 Accompanied by daughter. Daughter reports diarrhea on Tuesday. Very weak and poor Per daughter doesn't seem normal. Talking to people no longer here and saying things that do not make sense. Putting gum on lamp. Diarrhea resolved on Tuesday. Daughter reports fall last week . Large bruising noted to right flank area

## 2024-03-23 NOTE — ED Provider Notes (Signed)
 [place] [facility] AT [facility] Provider Note   CSN: [id number] Arrival date & time: 03/23/24  9241     Patient presents with: Altered Mental Status   Breanna Robinson is a 89 y.o. female.   Patient is an 89 year old female with a history of hypertension, diabetes, polymyalgia rheumatica on 20 mg of prednisone daily, chronic kidney disease and a heart murmur who is presenting today with her daughter due to altered mental status.  Her daughter reports that since Tuesday her mom has had change in her mental status.  She reports that she is talking out of her head a lot more and talking about people who are not there.  Also reports she is sticking gum on lamps and just doing things that are out of or the ordinary for her.  She reports that she always has some memory problems but it is usually not like this.  She reports on Tuesday she had multiple episodes of diarrhea and decreased oral intake.  She did give her a dose of Imodium and reports that yesterday she does not think she had much diarrhea but still did not have much of an appetite.  She says that she can never get her to drink much water but she has still been eating but not a lot.  The patient denies any headache, nausea, vomiting.  She denies any abdominal pain or urinary complaints.  Her daughter does report that within the last week she started giving her tramadol again because she was having more pain due to the weather but that is been the only medication change.  There has not been any fever that she is aware of.  The history is provided by the patient and a caregiver.  Altered Mental Status      Prior to Admission medications   Medication Sig Start Date End Date Taking? Authorizing Provider  cephALEXin  (KEFLEX ) 500 MG capsule Take 1 capsule (500 mg total) by mouth 2 (two) times daily. 03/23/24  Yes Aashrith Eves, Benton, MD  alendronate (FOSAMAX) 70 MG tablet Take by mouth. 08/09/20   [provider]   allopurinol (ZYLOPRIM) 100 MG tablet Take 1 tablet by mouth daily. 05/21/15   [provider]  amLODipine (NORVASC) 10 MG tablet Take 1 tablet by mouth daily. 10/28/20   [provider]  amLODipine (NORVASC) 5 MG tablet Take 1 tablet by mouth daily. 07/20/16   [provider]  aspirin 81 MG chewable tablet Chew by mouth.    [provider]  benazepril (LOTENSIN) 20 MG tablet Take 1 tablet by mouth daily. 01/19/17   [provider]  Blood Glucose Monitoring Suppl (GLUCOCOM BLOOD GLUCOSE MONITOR) DEVI 1 each by Misc.(Non-Drug; Combo Route) route daily. 05/30/18   [provider]  diclofenac Sodium (VOLTAREN) 1 % GEL Apply to affected joints twice a day as needed 10/25/18   [provider]  glucose blood (ONETOUCH VERIO) test strip USE WITH GLUCOMETER ONCE DAILY TO CHECK BLOOD SUGARS 08/13/20   [provider]  hydrochlorothiazide (HYDRODIURIL) 25 MG tablet Take 0.5 tablets by mouth daily. 12/08/16   [provider]  HYDROcodone -acetaminophen  (NORCO/VICODIN) 5-325 MG tablet Take 1 tablet by mouth every 6 (six) hours as needed for severe pain. 11/27/22   Randol Simmonds, MD  methocarbamol  (ROBAXIN ) 500 MG tablet Take 0.5 tablets (250 mg total) by mouth every 8 (eight) hours as needed for muscle spasms. 08/02/23   Patsey Lot, MD  potassium chloride (KLOR-CON) 10 MEQ tablet Take 1  tablet by mouth daily. 07/26/20   [provider]  prednisoLONE acetate (PRED FORTE) 1 % ophthalmic suspension PLACE 1 DROP INTO BOTH EYES 4 TIMES DAILY. 08/19/20   [provider]  predniSONE (DELTASONE) 5 MG tablet Take by mouth. 10/27/18   [provider]  simvastatin (ZOCOR) 10 MG tablet TAKE 1 TABLET BY MOUTH EVERY DAY AT NIGHT 03/01/17   [provider]  triamcinolone (KENALOG) 0.1 % paste APPLY AFTER MEALS AND AT BEDTIME 11/22/13   [provider]    Allergies: Patient has no known allergies.    Review of  Systems  Updated Vital Signs BP (!) 183/60   Pulse 69   Temp 98.9 F (37.2 C)   Resp 12   Wt 58.6 kg   SpO2 96%   BMI 23.63 kg/m   Physical Exam Vitals and nursing note reviewed.  Constitutional:      General: She is not in acute distress.    Appearance: She is well-developed.  HENT:     Head: Normocephalic and atraumatic.     Mouth/Throat:     Mouth: Mucous membranes are dry.  Eyes:     Pupils: Pupils are equal, round, and reactive to light.  Cardiovascular:     Rate and Rhythm: Normal rate and regular rhythm.     Pulses: Normal pulses.     Heart sounds: Murmur heard.     No friction rub.  Pulmonary:     Effort: Pulmonary effort is normal.     Breath sounds: Normal breath sounds. No wheezing or rales.  Abdominal:     General: Bowel sounds are normal. There is no distension.     Palpations: Abdomen is soft.     Tenderness: There is no abdominal tenderness. There is no guarding or rebound.  Musculoskeletal:        General: No tenderness. Normal range of motion.     Right lower leg: Edema present.     Left lower leg: Edema present.     Comments: 1+ pitting edema at the ankles bilaterally  Skin:    General: Skin is warm and dry.     Findings: No rash.  Neurological:     Mental Status: She is alert.     Cranial Nerves: No cranial nerve deficit.     Sensory: No sensory deficit.     Motor: No weakness.     Gait: Gait normal.     Comments: Oriented to person and place  Psychiatric:        Behavior: Behavior normal.     (all labs ordered are listed, but only abnormal results are displayed) Labs Reviewed  COMPREHENSIVE METABOLIC PANEL WITH GFR - Abnormal; Notable for the following components:      Result Value   Glucose, Bld 166 (*)    BUN 26 (*)    Creatinine, Ser 1.26 (*)    GFR, Estimated 41 (*)    Anion gap 16 (*)    All other components within normal limits  CBC - Abnormal; Notable for the following components:   WBC 11.6 (*)    Hemoglobin 11.7 (*)     RDW 15.9 (*)    All other components within normal limits  URINALYSIS, ROUTINE W REFLEX MICROSCOPIC - Abnormal; Notable for the following components:   APPearance TURBID (*)    Protein, ur 100 (*)    Leukocytes,Ua LARGE (*)    All other components within normal limits  URINALYSIS, MICROSCOPIC (REFLEX) - Abnormal; Notable for  the following components:   Bacteria, UA FEW (*)    Non Squamous Epithelial PRESENT (*)    All other components within normal limits  CBG MONITORING, ED - Abnormal; Notable for the following components:   Glucose-Capillary 152 (*)    All other components within normal limits  URINE CULTURE  CBG MONITORING, ED    EKG: EKG Interpretation Date/Time:  Thursday March 23 2024 08:13:08 EDT Ventricular Rate:  66 PR Interval:  132 QRS Duration:  95 QT Interval:  400 QTC Calculation: 420 R Axis:   -15  Text Interpretation: Sinus rhythm Probable left atrial enlargement Left ventricular hypertrophy Anterior ST elevation, probably due to LVH No significant change since last tracing Confirmed by Doretha Folks ([id number]) on 03/23/2024 8:26:44 AM  Radiology: CT Head Wo Contrast Result Date: 03/23/2024 CLINICAL DATA:  Mental status change, unknown cause EXAM: CT HEAD WITHOUT CONTRAST TECHNIQUE: Contiguous axial images were obtained from the base of the skull through the vertex without intravenous contrast. RADIATION DOSE REDUCTION: This exam was performed according to the departmental dose-optimization program which includes automated exposure control, adjustment of the mA and/or kV according to patient size and/or use of iterative reconstruction technique. COMPARISON:  CT of the head dated November 28, 2020. FINDINGS: Brain: Age-related atrophy and mild periventricular white matter disease. No evidence of hemorrhage, mass, cortical infarct or hydrocephalus. Vascular: Moderate calcifications within the carotid siphons. Skull: Intact and unremarkable. Sinuses/Orbits: Clear paranasal  sinuses. Status post bilateral lens replacement. Other: None. IMPRESSION: Age-related atrophy and mild periventricular white matter disease. Electronically Signed   By: Evalene Coho M.D.   On: 03/23/2024 09:35     Procedures   Medications Ordered in the ED  lactated ringers  bolus 250 mL (has no administration in time range)                                    Medical Decision Making Amount and/or Complexity of Data Reviewed External Data Reviewed: notes. Labs: ordered. Decision-making details documented in ED Course. Radiology: ordered and independent interpretation performed. Decision-making details documented in ED Course. ECG/medicine tests: ordered and independent interpretation performed. Decision-making details documented in ED Course.  Risk Prescription drug management.   Pt with multiple medical problems and comorbidities and presenting today with a complaint that caries a high risk for morbidity and mortality.  Here today due to symptoms most consistent with delirium.  She is hallucinating and doing unusual things.  Could be related to recent diarrheal illness, electrolyte abnormalities, AKI, adverse effects to tramadol, UTI, dehydration, stroke however this is less likely.  Also daughter reports patient fell last week and will ensure no evidence of intracranial hemorrhage.  Patient takes no anticoagulation..  Patient has some minimal swelling in her ankles which her daughter says is much better than what it had been.  Will give a small bolus.  Labs and imaging are pending.  I independently interpreted patient's EKG and labs.  EKG with findings of LVH but no acute findings.  CBC with a mild leukocytosis of 11, UA today with large leukocytes, greater than 50 white cells and few bacteria, CMP with normal electrolytes and stable creatinine at 1.26.  I have independently visualized and interpreted pt's images today.  Head CT negative for intermittent cranial bleed.  Radiology reports  age-related and mild periventricular white matter disease. Remains well-appearing.  Discussed the results with the patient and her daughter.  Will start  on Keflex  and urine culture was sent.  Patient and daughter were given return precautions.  Also they will discontinue tramadol.       Final diagnoses:  Urinary tract infection without hematuria, site unspecified  Delirium    ED Discharge Orders          Ordered    cephALEXin  (KEFLEX ) 500 MG capsule  2 times daily        03/23/24 1009               Doretha Folks, MD 03/23/24 1009

## 2024-03-23 NOTE — Discharge Instructions (Addendum)
 The blood work today all look good but your urine did show findings concerning for a urinary tract infection.  This could be why you have not been quite yourself recently but also could be related to the tramadol also discontinue that as well.  If you develop vomiting, fever, weakness and inability to get out of bed or shortness of breath or pain in your stomach you should return to the emergency room.

## 2024-03-26 LAB — URINE CULTURE: Culture: 30000 — AB

## 2024-03-27 ENCOUNTER — Telehealth (HOSPITAL_BASED_OUTPATIENT_CLINIC_OR_DEPARTMENT_OTHER): Payer: Self-pay | Admitting: *Deleted

## 2024-03-27 NOTE — Progress Notes (Signed)
 ED Antimicrobial Stewardship Positive Culture Follow Up   Breanna Robinson is an 89 y.o. female who presented to [facility] on 03/23/2024 with a chief complaint of  Chief Complaint  Patient presents with   Altered Mental Status    Recent Results (from the past 720 hours)  Urine Culture     Status: Abnormal   Collection Time: 03/23/24  8:21 AM   Specimen: Urine, Clean Catch  Result Value Ref Range Status   Specimen Description   Final    URINE, CLEAN CATCH Performed at [facility], [address] [place], [place] [address]    Special Requests   Final    NONE Performed at [facility], [address] [place], [place] [address]    Culture (A)  Final    30,000 COLONIES/mL PSEUDOMONAS AERUGINOSA 10,000 COLONIES/mL ENTEROCOCCUS FAECALIS    Report Status 03/26/2024 FINAL  Final   Organism ID, Bacteria PSEUDOMONAS AERUGINOSA (A)  Final   Organism ID, Bacteria ENTEROCOCCUS FAECALIS (A)  Final      Susceptibility   Enterococcus faecalis - MIC*    AMPICILLIN <=2 SENSITIVE Sensitive     NITROFURANTOIN <=16 SENSITIVE Sensitive     VANCOMYCIN 2 SENSITIVE Sensitive     * 10,000 COLONIES/mL ENTEROCOCCUS FAECALIS   Pseudomonas aeruginosa - MIC*    CEFTAZIDIME 8 SENSITIVE Sensitive     CIPROFLOXACIN <=0.25 SENSITIVE Sensitive     GENTAMICIN 4 SENSITIVE Sensitive     IMIPENEM 1 SENSITIVE Sensitive     * 30,000 COLONIES/mL PSEUDOMONAS AERUGINOSA    [x]  Treated with keflex , organism resistant to prescribed antimicrobial  New antibiotic prescription: Cipro 500mg  Q24 hours x7 days  ED Provider: Benton Shone, MD  Koren Or, PharmD Clinical Pharmacist 03/27/2024 10:00 AM Please check AMION for all [facility] numbers

## 2024-03-27 NOTE — Telephone Encounter (Signed)
 Post ED Visit - Positive Culture Follow-up  Culture report reviewed by antimicrobial stewardship pharmacist: [facility] [x]  Koren Or, Pharm.D. []  Venetia Gully, Pharm.D., BCPS AQ-ID []  Garrel Crews, Pharm.D., BCPS []  Almarie Lunger, [address].D., BCPS []  Darling, [address].D., BCPS, AAHIVP []  Rosaline Bihari, Pharm.D., BCPS, AAHIVP []  Vernell Meier, PharmD, BCPS []  Latanya Hint, PharmD, BCPS []  Donald Medley, PharmD, BCPS []  Rocky Bold, PharmD []  Dorothyann Alert, PharmD, BCPS []  Morene Babe, PharmD  [facility] []  Rosaline Edison, PharmD []  Romona Bliss, PharmD []  Dolphus Roller, PharmD []  Veva Seip, Rph []  Vernell Daunt) Leonce, PharmD []  Eva Allis, PharmD []  Rosaline Millet, PharmD []  Iantha Batch, PharmD []  Arvin Gauss, PharmD []  Wanda Hasting, PharmD []  Ronal Rav, PharmD []  Rocky Slade, PharmD []  Bard Jeans, PharmD   Positive urine culture Treated with cephalexin .  Pt c/o diarrhea, weakness, hallucinating.  Afebrile.  U/A bacteria few, large leukocytes, turbid.  Abx per ID and pt symptoms.  Plan per Dr. Benton Shone: Call pt; if symptoms have not resolved, stop keflex  & start Cipro.  Spoke with pt's daughter who stated that pt's symptoms have resolved.  No further patient follow-up is required at this time.  Lorita Barnie Pereyra 03/27/2024, 1:43 PM

## 2024-03-30 NOTE — Progress Notes (Signed)
 Female Nerve Block of Bilateral L3-4, 4 5, 5 S1  Nerve block  Date/Time: 03/30/2024 9:30 AM  Performed by: Camellia Krystal Custard, MD Authorized by: Camellia Krystal Custard, MD   Consent:    Consent obtained:  Verbal   Consent given by:  Patient   Risks, benefits, and alternatives were discussed: yes     Risks discussed:  Allergic reaction, infection, nerve damage, swelling, unsuccessful block, pain, intravenous injection and bleeding   Alternatives discussed:  No treatment Universal protocol:    Procedure explained and questions answered to patient or proxy's satisfaction: yes     Immediately prior to procedure, a time out was called: yes     Patient identity confirmed:  Verbally with patient Indications:    Indications:  Pain relief Location:    Body area:  Trunk   Trunk nerve:  Lumbar   Laterality:  Bilateral Pre-procedure details:    Skin preparation:  Povidone-iodine Skin anesthesia:    Skin anesthesia method:  None Procedure details:    Block needle gauge:  25 G   Guidance: fluoroscopy     Anesthesia technique: paravertebral facet joint block     Block anesthetic: 6% phenol Lot [id number] exp 9/25.   Additive injected:  None   Injection procedure:  Anatomic landmarks identified, negative aspiration for blood and incremental injection   Paresthesia:  None Post-procedure details:    Dressing:  None   Outcome:  Pain improved   Procedure completion:  Tolerated Comments:     After informed consent patient brought to procedure room laid in the prone position vital signs monitored by medical staff skin prepped with Betadine I then placed a 25-gauge needle into the medial branch location of L3-4, 4 5, 5 S1 and with small incremental injections I injected 6% phenol this with performed bilaterally no complication patient sent to recovery

## 2024-04-05 NOTE — Progress Notes (Signed)
 [address] - AMBULATORY [id number] - [facility] [address] [place] [id number]  Breanna Robinson DOB: 11/14/1932 Encounter Date: 04/05/2024   ASSESSMENT and PLAN:   Breanna Robinson was seen today for follow-up and urinary tract infection.  Diagnoses and all orders for this visit:  Acute cystitis without hematuria -     ciprofloxacin (CIPRO) 250 mg tablet; Take 1 tablet (250 mg total) by mouth 2 (two) times a day for 7 days. -     POC Urinalysis Auto without Microscopic  History of UTI -     POC Urinalysis Auto without Microscopic -     Urine Culture; Future -     Urine Culture  Leukocytes in urine -     Urine Culture; Future -     Urine Culture -     POC Urinalysis Auto without Microscopic     Discussed the prescription noted above, including potential side effects, drug interactions, instructions for taking the medication, and the consequences of not taking it.  Patient verbalized an understanding of these instructions and had no further questions.    Chief Complaint  Patient presents with  . Follow-up    From ED- uti  . Urinary Tract Infection    Return if symptoms worsen or fail to improve. Pt will need to return after she finishes her antibiotic for a urine sample- lab visit only.  SUBJECTIVE:   Breanna Robinson is a 89 y.o. female that presents to clinic today regarding the following issues:  Pt was seen in the ED on 03/23/24 for UTI and confusion. She is here today for a f/u- her daughter would like her urine to be checked to see if the antibiotics worked for her. She was treated with cephalexin  for 1 week. She does not endorse any urinary symptoms including no fever, chills, dysuria, frequency or urgency.  Her daughter states her confusion is better.  HISTORY:    I have reviewed the patients problem list, current medications, allergies, and social history and updated them as needed.  Medications Ordered Prior to Encounter[1] Allergies[2] Medical  History[3] Family History[4] Surgical History[5] Health Maintenance  Topic Date Due  . Diabetes: Foot Exam  08/30/2019  . Comprehensive Annual Visit  08/18/2023  . Medicare Annual Wellness (AWV) Subsequent Visits  08/18/2023  . COVID-19 Vaccine (8 - 2024-25 season) 12/18/2023  . Influenza Vaccine (1) 04/14/2024  . Diabetes: Hemoglobin A1C  01/30/2025  . Depression Screening  02/22/2025  . DTaP/Tdap/Td Vaccines (2 - Td or Tdap) 05/05/2029  . Adult RSV (60+ Years or Pregnancy)  Completed  . Pneumococcal Vaccine for Ages 50+  Completed  . ZOSTER VACCINE  Completed  . Bone Density Scan  Completed  . HIB Vaccines  Aged Out  . Hepatitis B Vaccines  Aged Out  . IPV Vaccines  Aged Out  . Hepatitis A Vaccines  Aged Out  . Meningococcal Conjugate (ACWY) Vaccine  Aged Out  . Rotavirus Vaccines  Aged Out  . HPV Vaccines  Aged Out  . Meningococcal B Vaccine  Aged Out  . Medicare Annual Wellness (AWV) Initial Visit  Discontinued  . Diabetes Screening  Discontinued    Breanna Robinson  reports that she has never smoked. She has never used smokeless tobacco.  OBJECTIVE:   Breanna Robinson  height is 1.575 m (5' 2) and weight is 60.3 kg (133 lb). Her temporal temperature is 97.4 F (36.3 C). Her blood pressure is 144/72 and her pulse is 65. Her  oxygen saturation is 93%.  Results for orders placed or performed in visit on 04/05/24  POC Urinalysis Auto without Microscopic   Collection Time: 04/05/24 11:00 AM  Result Value Ref Range   Color, Urine Yellow Yellow   Clarity, Urine Clear Clear   Glucose, Urine Negative Negative mg/dL   Bilirubin, Urine Negative Negative   Ketones, Urine Negative Negative mg/dL   Specific Gravity, Urine <=1.005 (A) 1.010, 1.015, 1.020, 1.025   Blood, Urine Negative Negative   pH, Urine 5.5 5.0, 5.5, 6.0, 6.5, 7.0, 7.5, 8.0   Protein, Urine Negative Negative mg/dL   Urobilinogen, Urine 0.2 <2.0 mg/dL   Nitrite, Urine Negative Negative   Leukocyte Esterase, Urine Moderate  (A) Negative   Kit/Device Lot # [id number]    Kit/Device Expiration Date 01/11/2025    Body mass index is 24.33 kg/m.   Review of Systems  All other systems reviewed and are negative.     PHYSICAL:    Physical Exam Constitutional:      Appearance: Normal appearance.  Abdominal:     General: Abdomen is flat. Bowel sounds are normal.     Palpations: Abdomen is soft.     Tenderness: There is abdominal tenderness in the suprapubic area. There is no right CVA tenderness or left CVA tenderness.     Comments: Minimal suprapubic tenderness   Neurological:     Mental Status: She is alert.   Will change antibiotics and recheck culture and follow up for urinalysis after completion of medication. Monitor for worsening symptoms  Test results were reviewed and analyzed as part of the medical decision making of this visit   This document serves as a record of services personally performed by Virginia  Fulbright PA-C.  It was created on their behalf by Arland Earnie Sor CMA, a trained medical scribe, and Certified Medical Assistant (CMA). During the course of documenting the history, physical exam and medical decision making, I was functioning as a Stage manager. The creation of this record is the provider's dictation and/or activities during the visit.  Electronically signed by Arland Earnie Sor, CMA 04/05/2024 10:16 AM    I agree the documentation is accurate and complete.  Electronically signed by: Virginia  Almarie Due, PA-C 04/05/2024 12:49 PM        [1] Current Outpatient Medications on File Prior to Visit  Medication Sig Dispense Refill  . Accu-Chek Guide test strips test strip 1 STRIP BY MISC.(NON-DRUG COMBO ROUTE) ROUTE 2 TIMES DAILY. 200 strip 3  . Accu-Chek Softclix Lancets misc USE TO TEST ONCE DAILY 100 each 3  . alendronate (FOSAMAX) 70 mg tablet Take 1 tablet (70 mg total) by mouth every 7 days. 12 tablet 3  . allopurinoL (ZYLOPRIM) 100 mg tablet Take 1 tablet (100  mg total) by mouth daily. 90 tablet 1  . amLODIPine (NORVASC) 10 mg tablet Take 1 tablet (10 mg total) by mouth daily. 90 tablet 1  . aspirin 81 mg chewable tablet Take 81 mg by mouth Once Daily.    . benazepriL (LOTENSIN) 20 mg tablet Take 1 tablet (20 mg total) by mouth daily. 90 tablet 3  . benazepriL (LOTENSIN) 40 mg tablet Take 1 tablet (40 mg total) by mouth daily Indications: high blood pressure. 90 tablet 1  . blood-glucose meter misc 1 each by miscellaneous route 2 (two) times a day. 1 each 0  . cyclobenzaprine (FLEXERIL) 10 mg tablet TAKE 0.5 TABLETS (5 MG TOTAL) BY MOUTH 2 (TWO) TIMES A DAY AS NEEDED FOR MUSCLE  SPASMS. 30 tablet 1  . donepeziL (ARICEPT) 10 mg tablet Take 1 tablet (10 mg total) by mouth every morning. 30 tablet 11  . furosemide (LASIX) 20 mg tablet Take 1 tablet (20 mg total) by mouth once as needed (if weight gain 3 pounds or more). 90 tablet 1  . hydrALAZINE (APRESOLINE) 25 mg tablet Take 1 tablet (25 mg total) by mouth 2 (two) times a day. 180 tablet 1  . hydroCHLOROthiazide (HYDRODIURIL) 25 mg tablet Take 0.5 tablets (12.5 mg total) by mouth daily. 45 tablet 1  . hydrocortisone (ANUSOL-HC) 2.5 % rectal cream INSERT INTO THE RECTUM 2 TIMES A DAY FOR 10 DAYS.    . Januvia 50 mg tablet TAKE ONE TABLET BY MOUTH EVERY DAY 90 tablet 2  . ketoconazole (NIZORAL) 2 % shampoo See Admin Instructions. PLEASE SEE ATTACHED FOR DETAILED DIRECTIONS    . memantine (NAMENDA) 5 mg tablet Take 1 tablet (5 mg total) by mouth 2 (two) times a day. 60 tablet 11  . mupirocin (BACTROBAN) 2 % ointment Apply topically 2 (two) times a day. 30 g 1  . predniSONE (DELTASONE) 5 mg tablet Take 4 tablets (20 mg total) by mouth daily. 120 tablet 1  . simvastatin (ZOCOR) 10 mg tablet Take 1 tablet (10 mg total) by mouth nightly. 90 tablet 1  . traMADoL (ULTRAM) 50 mg tablet Take 1 tablet (50 mg total) by mouth every 8 (eight) hours as needed for moderate pain (4-6). 90 tablet 3  . triamcinolone  acetonide (KENALOG) 0.1 % cream APPLY A SMALL AMOUNT TO SKIN TWICE A DAY    . clobetasoL (TEMOVATE) 0.05 % scalp solution See Admin Instructions. PLEASE SEE ATTACHED FOR DETAILED DIRECTIONS (Patient not taking: Reported on 04/05/2024)    . [DISCONTINUED] fluconazole (DIFLUCAN) 150 mg tablet Take 1 tablet by mouth once a week x 6 weeks (Patient not taking: Reported on 04/05/2024) 6 tablet 0   No current facility-administered medications on file prior to visit.  [2] No Known Allergies [3] Past Medical History: Diagnosis Date  . Abnormal electrocardiogram   . Acute gout of left wrist   . Acute lateral meniscus tear of right knee   . Anarthritic rheumatoid disease (CMD)   . Anemia, unspecified 10/30/2015  . Arthritis   . Benign neoplasm of colon 10/30/2015  . Benign neoplasm of large intestine   . Chalazion   . Chalazion left upper eyelid 12/13/2015  . Chondromalacia of knee, right   . Chronic kidney disease   . Cortical age-related cataract of right eye 06/20/2020  . Degenerative disc disease at L5-S1 level 03/08/2018  . Degenerative disc disease, cervical   . Diabetes mellitus    (CMD)   . Diabetes mellitus type II, controlled, with no complications    (CMD) 10/30/2015  . Diastolic dysfunction   . Dry eyes   . Essential hypertension 10/30/2015  . Facial myokymia   . Herniated cervical disc   . Hypercholesterolemia   . Hyperlipemia   . Hypertension   . Hypokalemia   . Impacted cerumen of left ear   . Iron deficiency anemia   . Left ear pain   . Leucocytosis 10/30/2015  . Memory loss   . Nuclear senile cataract, right 05/13/2020   Added automatically from request for surgery [id number]  . Polymyalgia rheumatica (CMD)   . Primary osteoarthritis of right knee   . Spondylosis of cervical region without myelopathy or radiculopathy   . Symptomatic menopausal or female climacteric states   [4]  Family History Problem Relation Name Age of Onset  . Hypertension Mother A. S.   . Diabetes  Mother A. S.   . Breast cancer Mother A. S. 66  . Dementia Mother A. S.   . Cancer Mother A. S.   . Heart disease Father H. S.   . Diabetes Father H. S.   . Hypertension Father H. S.   . Ovarian cancer Sister Sister   . Glaucoma Neg Hx    . Macular degeneration Neg Hx    . Retinal detachment Neg Hx    . Rheum arthritis Neg Hx    . Lupus Neg Hx    . Ankylosing spondylitis Neg Hx    . Psoriasis Neg Hx    . Gout Neg Hx    [5] Past Surgical History: Procedure Laterality Date  . BLADDER SUSPENSION     Procedure: BLADDER SUSPENSION  . CATARACT EXTRACTION Left 07/01/2020   Procedure: CATARACT EXTRACTION; YVR  . CATARACT EXTRACTION W/  INTRAOCULAR LENS IMPLANT Left 07/01/2020   Procedure: PHACOEMULSIFICATION PC / IOL COMPLEX GMO;  Surgeon: [place] Tonia Obey, MD;  Location: [facility];  Service: Ophthalmology;  Laterality: Left;  diabetic, trypan blue, PF TA  . CATARACT EXTRACTION W/  INTRAOCULAR LENS IMPLANT Right 07/15/2020   Procedure: PHACOEMULSIFICATION PC / IOL LEVEL 1;  Surgeon: [place] Tonia Obey, MD;  Location: [facility];  Service: Ophthalmology;  Laterality: Right;  iris expansion, malyugin ring, PF Lidocaine, PF TA, Omidria  . CYSTOCELE REPAIR     Procedure: CYSTOCELE REPAIR  . DENTAL SURGERY      Procedure: DENTAL SURGERY  . HYSTERECTOMY      Procedure: HYSTERECTOMY  . KNEE ARTHROSCOPY W/ DEBRIDEMENT Right    Procedure: KNEE ARTHROSCOPY W/ DEBRIDEMENT  . KNEE ARTHROSCOPY W/ MENISCAL REPAIR Right    Procedure: KNEE ARTHROSCOPY W/ MENISCAL REPAIR  . KNEE SURGERY     Procedure: KNEE SURGERY  . PARTIAL HYSTERECTOMY    . RETINAL DETACHMENT SURGERY Right 2007   Procedure: RETINAL DETACHMENT SURGERY; [facility]  . ROTATOR CUFF REPAIR Right    Procedure: ROTATOR CUFF REPAIR  . SHOULDER SURGERY     Procedure: SHOULDER SURGERY  . TOENAIL EXCISION Bilateral    Procedure: TOENAIL EXCISION; ingrown

## 2024-04-15 ENCOUNTER — Encounter (HOSPITAL_BASED_OUTPATIENT_CLINIC_OR_DEPARTMENT_OTHER): Payer: Self-pay

## 2024-04-15 ENCOUNTER — Other Ambulatory Visit: Payer: Self-pay

## 2024-04-15 ENCOUNTER — Emergency Department (HOSPITAL_BASED_OUTPATIENT_CLINIC_OR_DEPARTMENT_OTHER)
Admission: EM | Admit: 2024-04-15 | Discharge: 2024-04-15 | Disposition: A | Discharge status: Home / Self Care | Attending: Emergency Medicine | Admitting: Emergency Medicine

## 2024-04-15 ENCOUNTER — Emergency Department (HOSPITAL_BASED_OUTPATIENT_CLINIC_OR_DEPARTMENT_OTHER)

## 2024-04-15 DIAGNOSIS — R5383 Other fatigue: Secondary | ICD-10-CM | POA: Insufficient documentation

## 2024-04-15 DIAGNOSIS — K862 Cyst of pancreas: Secondary | ICD-10-CM | POA: Diagnosis not present

## 2024-04-15 DIAGNOSIS — R109 Unspecified abdominal pain: Secondary | ICD-10-CM | POA: Insufficient documentation

## 2024-04-15 DIAGNOSIS — Z7982 Long term (current) use of aspirin: Secondary | ICD-10-CM | POA: Diagnosis not present

## 2024-04-15 DIAGNOSIS — R300 Dysuria: Secondary | ICD-10-CM | POA: Diagnosis present

## 2024-04-15 HISTORY — DX: Disorder of kidney and ureter, unspecified: N28.9

## 2024-04-15 LAB — DIFFERENTIAL
Abs Immature Granulocytes: 0.12 K/uL — ABNORMAL HIGH (ref 0.00–0.07)
Basophils Absolute: 0 K/uL (ref 0.0–0.1)
Basophils Relative: 0 %
Eosinophils Absolute: 0 K/uL (ref 0.0–0.5)
Eosinophils Relative: 0 %
Immature Granulocytes: 1 %
Lymphocytes Relative: 7 %
Lymphs Abs: 0.7 K/uL (ref 0.7–4.0)
Monocytes Absolute: 0.3 K/uL (ref 0.1–1.0)
Monocytes Relative: 3 %
Neutro Abs: 9.4 K/uL — ABNORMAL HIGH (ref 1.7–7.7)
Neutrophils Relative %: 89 %

## 2024-04-15 LAB — URINALYSIS, W/ REFLEX TO CULTURE (INFECTION SUSPECTED)
Bilirubin Urine: NEGATIVE
Glucose, UA: NEGATIVE mg/dL
Hgb urine dipstick: NEGATIVE
Ketones, ur: NEGATIVE mg/dL
Nitrite: NEGATIVE
Protein, ur: NEGATIVE mg/dL
RBC / HPF: NONE SEEN RBC/hpf (ref 0–5)
Specific Gravity, Urine: 1.015 (ref 1.005–1.030)
pH: 6 (ref 5.0–8.0)

## 2024-04-15 LAB — CBC
HCT: 35.6 % — ABNORMAL LOW (ref 36.0–46.0)
Hemoglobin: 11.6 g/dL — ABNORMAL LOW (ref 12.0–15.0)
MCH: 28.2 pg (ref 26.0–34.0)
MCHC: 32.6 g/dL (ref 30.0–36.0)
MCV: 86.6 fL (ref 80.0–100.0)
Platelets: 158 K/uL (ref 150–400)
RBC: 4.11 MIL/uL (ref 3.87–5.11)
RDW: 15.9 % — ABNORMAL HIGH (ref 11.5–15.5)
WBC: 10.5 K/uL (ref 4.0–10.5)
nRBC: 0 % (ref 0.0–0.2)

## 2024-04-15 LAB — COMPREHENSIVE METABOLIC PANEL WITH GFR
ALT: 107 U/L — ABNORMAL HIGH (ref 0–44)
AST: 51 U/L — ABNORMAL HIGH (ref 15–41)
Albumin: 4.7 g/dL (ref 3.5–5.0)
Alkaline Phosphatase: 80 U/L (ref 38–126)
Anion gap: 13 (ref 5–15)
BUN: 36 mg/dL — ABNORMAL HIGH (ref 8–23)
CO2: 24 mmol/L (ref 22–32)
Calcium: 9.5 mg/dL (ref 8.9–10.3)
Chloride: 103 mmol/L (ref 98–111)
Creatinine, Ser: 1.6 mg/dL — ABNORMAL HIGH (ref 0.44–1.00)
GFR, Estimated: 31 mL/min — ABNORMAL LOW (ref >60)
Glucose, Bld: 188 mg/dL — ABNORMAL HIGH (ref 70–99)
Potassium: 4.7 mmol/L (ref 3.5–5.1)
Sodium: 140 mmol/L (ref 135–145)
Total Bilirubin: 0.9 mg/dL (ref 0.0–1.2)
Total Protein: 6.7 g/dL (ref 6.5–8.1)

## 2024-04-15 LAB — MAGNESIUM: Magnesium: 2.1 mg/dL (ref 1.7–2.4)

## 2024-04-15 MED ORDER — SODIUM CHLORIDE 0.9 % IV BOLUS
500.0000 mL | Freq: Once | INTRAVENOUS | Status: AC
  Administered 2024-04-15: 500 mL via INTRAVENOUS

## 2024-04-15 MED ORDER — SULFAMETHOXAZOLE-TRIMETHOPRIM 800-160 MG PO TABS: 1.0000 | ORAL_TABLET | Freq: Two times a day (BID) | ORAL | 0 refills | Status: AC

## 2024-04-15 NOTE — ED Triage Notes (Signed)
 Arrived POV accompanied by daughter. Daughter reports completed antibiotics for UTI on Wednesday. Up all night talking and laughing. Talking about things not happening. Loose stools and weak. Pt denies pain. Urine clear yellow

## 2024-04-15 NOTE — ED Notes (Signed)
 Pt assisted to [facility]

## 2024-04-15 NOTE — Discharge Instructions (Signed)
 You have been seen and discharged from the emergency department.  Blood work showed a mildly elevated kidney dysfunction, possibly from dehydration.  CT scan showed no anatomical or emergent finding.  Urinalysis had a lot of white blood cells and bacteria so we will treat for possible infection.  Take antibiotic as directed for 3 days.  Urine culture has been sent, if there is any abnormal findings you will be contacted.  Follow-up with your primary provider for further evaluation and further care. Take home medications as prescribed. If you have any worsening symptoms or further concerns for your health please return to an emergency department for further evaluation.

## 2024-04-15 NOTE — ED Provider Notes (Signed)
 [place] [facility] Provider Note   CSN: [id number] Arrival date & time: 04/15/24  0745     Patient presents with: Recurrent UTI   Breanna Robinson is a 89 y.o. female.   HPI   89 year old female presents emergency department accompanied by daughter for concern of UTI.  Daughter states about 3 weeks ago patient was initially diagnosed with a UTI, put on a short course of Keflex .  At a follow-up appointment she felt like the symptoms were still ongoing and the doctor rechecked her urinalysis/urine culture and placed her on ciprofloxacin.  She completed this regimen 4 days ago.  However now the patient is presenting with generalized malaise, an episode of diarrhea, burning with urination.  Denies any fever, vomiting, chest pain, cough.  Slight decrease in appetite specifically with oral hydration.  Prior to Admission medications   Medication Sig Start Date End Date Taking? Authorizing Provider  alendronate (FOSAMAX) 70 MG tablet Take by mouth. 08/09/20   [provider]  allopurinol (ZYLOPRIM) 100 MG tablet Take 1 tablet by mouth daily. 05/21/15   [provider]  amLODipine (NORVASC) 10 MG tablet Take 1 tablet by mouth daily. 10/28/20   [provider]  amLODipine (NORVASC) 5 MG tablet Take 1 tablet by mouth daily. 07/20/16   [provider]  aspirin 81 MG chewable tablet Chew by mouth.    [provider]  benazepril (LOTENSIN) 20 MG tablet Take 1 tablet by mouth daily. 01/19/17   [provider]  Blood Glucose Monitoring Suppl (GLUCOCOM BLOOD GLUCOSE MONITOR) DEVI 1 each by Misc.(Non-Drug; Combo Route) route daily. 05/30/18   [provider]  cephALEXin  (KEFLEX ) 500 MG capsule Take 1 capsule (500 mg total) by mouth 2 (two) times daily. 03/23/24   Doretha Folks, MD  diclofenac Sodium (VOLTAREN) 1 % GEL Apply to affected joints twice a day as needed 10/25/18   [provider]  glucose blood  (ONETOUCH VERIO) test strip USE WITH GLUCOMETER ONCE DAILY TO CHECK BLOOD SUGARS 08/13/20   [provider]  hydrochlorothiazide (HYDRODIURIL) 25 MG tablet Take 0.5 tablets by mouth daily. 12/08/16   [provider]  HYDROcodone -acetaminophen  (NORCO/VICODIN) 5-325 MG tablet Take 1 tablet by mouth every 6 (six) hours as needed for severe pain. 11/27/22   Randol Simmonds, MD  methocarbamol  (ROBAXIN ) 500 MG tablet Take 0.5 tablets (250 mg total) by mouth every 8 (eight) hours as needed for muscle spasms. 08/02/23   Patsey Lot, MD  potassium chloride (KLOR-CON) 10 MEQ tablet Take 1 tablet by mouth daily. 07/26/20   [provider]  prednisoLONE acetate (PRED FORTE) 1 % ophthalmic suspension PLACE 1 DROP INTO BOTH EYES 4 TIMES DAILY. 08/19/20   [provider]  predniSONE (DELTASONE) 5 MG tablet Take by mouth. 10/27/18   [provider]  simvastatin (ZOCOR) 10 MG tablet TAKE 1 TABLET BY MOUTH EVERY DAY AT NIGHT 03/01/17   [provider]  triamcinolone (KENALOG) 0.1 % paste APPLY AFTER MEALS AND AT BEDTIME 11/22/13   [provider]    Allergies: Patient has no known allergies.    Review of Systems  Constitutional:  Positive for appetite change and fatigue. Negative for fever.  Respiratory:  Negative for shortness of breath.   Cardiovascular:  Negative for chest pain.  Gastrointestinal:  Positive for diarrhea. Negative for abdominal pain and vomiting.  Genitourinary:  Positive for dysuria. Negative for difficulty urinating, flank pain, frequency and hematuria.  Skin:  Negative for  rash.  Neurological:  Negative for headaches.    Updated Vital Signs BP (!) 159/60   Pulse 62   Temp 97.9 F (36.6 C) (Oral)   Resp 18   Wt 58.7 kg   SpO2 95%   BMI 23.67 kg/m   Physical Exam Vitals and nursing note reviewed.  Constitutional:      General: She is not in acute distress.    Appearance: Normal appearance.  HENT:     Head:  Normocephalic.     Mouth/Throat:     Mouth: Mucous membranes are moist.  Cardiovascular:     Rate and Rhythm: Normal rate.  Pulmonary:     Effort: Pulmonary effort is normal. No respiratory distress.  Abdominal:     General: Bowel sounds are normal.     Palpations: Abdomen is soft.     Tenderness: There is no abdominal tenderness. There is no guarding or rebound.  Skin:    General: Skin is warm.  Neurological:     Mental Status: She is alert and oriented to person, place, and time. Mental status is at baseline.  Psychiatric:        Mood and Affect: Mood normal.     (all labs ordered are listed, but only abnormal results are displayed) Labs Reviewed  URINALYSIS, W/ REFLEX TO CULTURE (INFECTION SUSPECTED) - Abnormal; Notable for the following components:      Result Value   Color, Urine STRAW (*)    APPearance CLOUDY (*)    Leukocytes,Ua LARGE (*)    Bacteria, UA MANY (*)    All other components within normal limits  URINE CULTURE  CBC WITH DIFFERENTIAL/PLATELET  COMPREHENSIVE METABOLIC PANEL WITH GFR  MAGNESIUM    EKG: None  Radiology: No results found.   Procedures   Medications Ordered in the ED  sodium chloride  0.9 % bolus 500 mL (500 mLs Intravenous New Bag/Given 04/15/24 [id number])                                    Medical Decision Making Amount and/or Complexity of Data Reviewed Labs: ordered. Radiology: ordered.  Risk Prescription drug management.   89 year old female presents emergency department accompanied by her daughter for concern of return of UTI.  She states that she became very fatigued last night and not interactive.  Is a decreased appetite and is not drinking fluids.  Recently completed antibiotic regimen for UTI.  Vitals are normal stable.  Abdomen is benign.  Urinalysis shows large amount of leukocytes as well as bacteria.  Blood work is reassuring, no abnormal white blood cells.  Kidney function is slightly elevated from baseline, most  likely dehydration.  No other findings of SIRS/sepsis.  With the mild kidney dysfunction CT renal study was done which shows no anatomical or emergent finding.  Urine culture will be sent.  Given the amount of white blood cells/bacteria with ongoing dysuria will treat with antibiotic.  Plan for outpatient follow-up and close monitoring.  Patient at this time appears safe and stable for discharge and close outpatient follow up. Discharge plan and strict return to ED precautions discussed, patient verbalizes understanding and agreement.     Final diagnoses:  None    ED Discharge Orders     None          Bari Roxie HERO, DO 04/15/24 1402

## 2024-04-18 LAB — URINE CULTURE: Culture: 70000 — AB

## 2024-04-19 ENCOUNTER — Telehealth (HOSPITAL_BASED_OUTPATIENT_CLINIC_OR_DEPARTMENT_OTHER): Payer: Self-pay | Admitting: *Deleted

## 2024-04-19 NOTE — Telephone Encounter (Signed)
 Post ED Visit - Positive Culture Follow-up  Culture report reviewed by antimicrobial stewardship pharmacist: [facility] [x]  Koren Or, Pharm.D. []  Venetia Gully, Pharm.D., BCPS AQ-ID []  Garrel Crews, Pharm.D., BCPS []  Almarie Lunger, Pharm.D., BCPS []  [place], [address].D., BCPS, AAHIVP []  Rosaline Bihari, Pharm.D., BCPS, AAHIVP []  Vernell Meier, PharmD, BCPS []  Latanya Hint, PharmD, BCPS []  Donald Medley, PharmD, BCPS []  Rocky Bold, PharmD []  Dorothyann Alert, PharmD, BCPS []  Morene Babe, PharmD  [facility] []  Rosaline Edison, PharmD []  Romona Bliss, PharmD []  Dolphus Roller, PharmD []  Veva Seip, Rph []  Vernell Daunt) Leonce, PharmD []  Eva Allis, PharmD []  Rosaline Millet, PharmD []  Iantha Batch, PharmD []  Arvin Gauss, PharmD []  Wanda Hasting, PharmD []  Ronal Rav, PharmD []  Rocky Slade, PharmD []  Bard Jeans, PharmD   Positive urine culture Treated with recent keflex ; at follow up got cipro x 7 days and completed 4 days prior to presenting in ED.  Plan per EDP, Dr. Cleotis: call for urinary sx check--if not better will need to be evaluated for IV abx.  Spoke with pt's daughter, Breanna Robinson, who states the pt's sx have resolved.  No further follow up needed.  Breanna Robinson 04/19/2024, 11:57 AM

## 2024-06-09 ENCOUNTER — Encounter (HOSPITAL_BASED_OUTPATIENT_CLINIC_OR_DEPARTMENT_OTHER): Payer: Self-pay

## 2024-06-09 ENCOUNTER — Emergency Department (HOSPITAL_BASED_OUTPATIENT_CLINIC_OR_DEPARTMENT_OTHER)
Admission: EM | Admit: 2024-06-09 | Discharge: 2024-06-09 | Disposition: A | Discharge status: Home / Self Care | Attending: Emergency Medicine | Admitting: Emergency Medicine

## 2024-06-09 ENCOUNTER — Other Ambulatory Visit: Payer: Self-pay

## 2024-06-09 ENCOUNTER — Emergency Department (HOSPITAL_BASED_OUTPATIENT_CLINIC_OR_DEPARTMENT_OTHER)

## 2024-06-09 DIAGNOSIS — R4182 Altered mental status, unspecified: Secondary | ICD-10-CM | POA: Insufficient documentation

## 2024-06-09 DIAGNOSIS — F0390 Unspecified dementia without behavioral disturbance: Secondary | ICD-10-CM | POA: Insufficient documentation

## 2024-06-09 DIAGNOSIS — Z7982 Long term (current) use of aspirin: Secondary | ICD-10-CM | POA: Insufficient documentation

## 2024-06-09 DIAGNOSIS — N3090 Cystitis, unspecified without hematuria: Secondary | ICD-10-CM | POA: Diagnosis not present

## 2024-06-09 DIAGNOSIS — E119 Type 2 diabetes mellitus without complications: Secondary | ICD-10-CM | POA: Insufficient documentation

## 2024-06-09 DIAGNOSIS — I10 Essential (primary) hypertension: Secondary | ICD-10-CM | POA: Insufficient documentation

## 2024-06-09 DIAGNOSIS — Z79899 Other long term (current) drug therapy: Secondary | ICD-10-CM | POA: Diagnosis not present

## 2024-06-09 DIAGNOSIS — R350 Frequency of micturition: Secondary | ICD-10-CM | POA: Diagnosis present

## 2024-06-09 HISTORY — DX: Unspecified dementia, unspecified severity, without behavioral disturbance, psychotic disturbance, mood disturbance, and anxiety: F03.90

## 2024-06-09 LAB — CBC WITH DIFFERENTIAL/PLATELET
Abs Immature Granulocytes: 0.13 K/uL — ABNORMAL HIGH (ref 0.00–0.07)
Basophils Absolute: 0 K/uL (ref 0.0–0.1)
Basophils Relative: 0 %
Eosinophils Absolute: 0 K/uL (ref 0.0–0.5)
Eosinophils Relative: 0 %
HCT: 36 % (ref 36.0–46.0)
Hemoglobin: 11.5 g/dL — ABNORMAL LOW (ref 12.0–15.0)
Immature Granulocytes: 1 %
Lymphocytes Relative: 9 %
Lymphs Abs: 1 K/uL (ref 0.7–4.0)
MCH: 28.8 pg (ref 26.0–34.0)
MCHC: 31.9 g/dL (ref 30.0–36.0)
MCV: 90 fL (ref 80.0–100.0)
Monocytes Absolute: 0.6 K/uL (ref 0.1–1.0)
Monocytes Relative: 5 %
Neutro Abs: 9.3 K/uL — ABNORMAL HIGH (ref 1.7–7.7)
Neutrophils Relative %: 85 %
Platelets: 177 K/uL (ref 150–400)
RBC: 4 MIL/uL (ref 3.87–5.11)
RDW: 16 % — ABNORMAL HIGH (ref 11.5–15.5)
WBC: 11.1 K/uL — ABNORMAL HIGH (ref 4.0–10.5)
nRBC: 0 % (ref 0.0–0.2)

## 2024-06-09 LAB — URINALYSIS, W/ REFLEX TO CULTURE (INFECTION SUSPECTED)
Bilirubin Urine: NEGATIVE
Glucose, UA: NEGATIVE mg/dL
Hgb urine dipstick: NEGATIVE
Ketones, ur: NEGATIVE mg/dL
Nitrite: NEGATIVE
Protein, ur: NEGATIVE mg/dL
Specific Gravity, Urine: 1.02 (ref 1.005–1.030)
pH: 6 (ref 5.0–8.0)

## 2024-06-09 LAB — COMPREHENSIVE METABOLIC PANEL WITH GFR
ALT: 45 U/L — ABNORMAL HIGH (ref 0–44)
AST: 41 U/L (ref 15–41)
Albumin: 4.7 g/dL (ref 3.5–5.0)
Alkaline Phosphatase: 69 U/L (ref 38–126)
Anion gap: 16 — ABNORMAL HIGH (ref 5–15)
BUN: 26 mg/dL — ABNORMAL HIGH (ref 8–23)
CO2: 25 mmol/L (ref 22–32)
Calcium: 10.2 mg/dL (ref 8.9–10.3)
Chloride: 105 mmol/L (ref 98–111)
Creatinine, Ser: 1.33 mg/dL — ABNORMAL HIGH (ref 0.44–1.00)
GFR, Estimated: 38 mL/min — ABNORMAL LOW (ref >60)
Glucose, Bld: 123 mg/dL — ABNORMAL HIGH (ref 70–99)
Potassium: 4.2 mmol/L (ref 3.5–5.1)
Sodium: 145 mmol/L (ref 135–145)
Total Bilirubin: 0.7 mg/dL (ref 0.0–1.2)
Total Protein: 7 g/dL (ref 6.5–8.1)

## 2024-06-09 LAB — CK: Total CK: 80 U/L (ref 38–234)

## 2024-06-09 MED ORDER — LACTATED RINGERS IV BOLUS
500.0000 mL | Freq: Once | INTRAVENOUS | Status: AC
  Administered 2024-06-09: 500 mL via INTRAVENOUS

## 2024-06-09 MED ORDER — CIPROFLOXACIN HCL 250 MG PO TABS: 375.0000 mg | ORAL_TABLET | Freq: Two times a day (BID) | ORAL | 0 refills | Status: AC

## 2024-06-09 NOTE — ED Notes (Signed)
 Pt assisted up to [facility] via w/c obtained urine spec  via use of hat ,which was sent to [facility]

## 2024-06-09 NOTE — Discharge Instructions (Signed)
 Dione Dupin  Thank you for allowing us  to take care of you today.  You came to the Emergency Department today because Shene was acting different from her usual self this morning, which she has done before if she has a urine infection.  Here in the emergency department her labs are reassuring, she is not dehydrated, however she does have evidence of a urine infection.  Based on her prior infections we are starting her on a course of Cipro .  You should keep your follow-up with urology next month.  Cipro  uncommonly can cause inflammation in the tendons, and very uncommonly a tear in the tendons.  If she begins complaining of severe pain, particularly at the knees or ankles, that would be a reason to come to the emergency department or her PCP for reevaluation.  To-Do: 1. Please follow-up with your primary doctor within 1 week/ as soon as possible.   Please return to the Emergency Department or call 911 if you experience have worsening of your symptoms, or do not get better, chest pain, shortness of breath, severe or significantly worsening pain, high fever, severe confusion, pass out or have any reason to think that you need emergency medical care.   We hope you feel better soon.   Mitzie Later, MD Department of Emergency Medicine [facility]

## 2024-06-09 NOTE — ED Triage Notes (Addendum)
 Daughter states when she went to wake up patient this morning she saw the room wasn't as it was when she put her to sleep, was talking to someone who wasn't there and was acting confused. Hx of UTI w/ similar symptoms. Hx of dementia. NAD noted in triage  LKW last night at 2000.

## 2024-06-09 NOTE — ED Provider Notes (Signed)
 [facility] Provider Note   CSN: [id number] Arrival date & time: 06/09/24  9173     History Chief Complaint  Patient presents with   Altered Mental Status    HPI: Breanna Robinson is a 89 y.o. female with history pertinent dementia, chronic incontinence, T2DM, HTN, polymyalgia rheumatica, arthritis who presents complaining of change in mental status. Patient arrived via POV accompanied by daughter.  History provided by patient and relative: Daughter.  No interpreter required during this encounter.  Daughter reports that patient was in her normal state of health last night when she went to bed at 8 PM.  Reports that this morning when she went to go check on her mother she noticed that her mother had rearrange some items in her room, and also spoke of visiting a little boy overnight.  Reports that that the patient has made similar statements symptoms similar rearrangements when she previously had a.  Reports that otherwise she is back to baseline at this time.  Reports that she has chronic pain related to her polymyalgia rheumatica, however has not had any new or different complaints of pain from baseline.  Reports that patient has a history of chronic incontinence, and occasionally fluids independently, occasionally requires the use of depends, however patient has had increased frequency of incontinence over the past approximately week.  Also has a history of recurrent UTIs, they have an appointment next week with the urologist for further evaluation of this.  Otherwise patient has not had any recent trauma or falls.  Daughter reports that patient does have a history of dehydration, and has poor oral intake at baseline, and is concerned currently regarding dehydration.  Patient denies dysuria, chest pain, shortness of breath, acute complaint at this time  Patient's recorded medical, surgical, social, medication list and allergies were reviewed in the  Snapshot window as part of the initial history.   Prior to Admission medications   Medication Sig Start Date End Date Taking? Authorizing Provider  ciprofloxacin  (CIPRO ) 250 MG tablet Take 1.5 tablets (375 mg total) by mouth every 12 (twelve) hours for 5 days. 06/09/24 06/14/24 Yes Rogelia Jerilynn RAMAN, MD  alendronate (FOSAMAX) 70 MG tablet Take by mouth. 08/09/20   [provider]  allopurinol (ZYLOPRIM) 100 MG tablet Take 1 tablet by mouth daily. 05/21/15   [provider]  amLODipine (NORVASC) 10 MG tablet Take 1 tablet by mouth daily. 10/28/20   [provider]  amLODipine (NORVASC) 5 MG tablet Take 1 tablet by mouth daily. 07/20/16   [provider]  aspirin 81 MG chewable tablet Chew by mouth.    [provider]  benazepril (LOTENSIN) 20 MG tablet Take 1 tablet by mouth daily. 01/19/17   [provider]  Blood Glucose Monitoring Suppl (GLUCOCOM BLOOD GLUCOSE MONITOR) DEVI 1 each by Misc.(Non-Drug; Combo Route) route daily. 05/30/18   [provider]  cephALEXin  (KEFLEX ) 500 MG capsule Take 1 capsule (500 mg total) by mouth 2 (two) times daily. 03/23/24   Doretha Folks, MD  diclofenac Sodium (VOLTAREN) 1 % GEL Apply to affected joints twice a day as needed 10/25/18   [provider]  glucose blood (ONETOUCH VERIO) test strip USE WITH GLUCOMETER ONCE DAILY TO CHECK BLOOD SUGARS 08/13/20   [provider]  hydrochlorothiazide (HYDRODIURIL) 25 MG tablet Take 0.5 tablets by mouth daily. 12/08/16   [provider]  HYDROcodone -acetaminophen  (NORCO/VICODIN) 5-325 MG tablet Take 1 tablet by mouth every 6 (six) hours  as needed for severe pain. 11/27/22   Randol Simmonds, MD  methocarbamol  (ROBAXIN ) 500 MG tablet Take 0.5 tablets (250 mg total) by mouth every 8 (eight) hours as needed for muscle spasms. 08/02/23   Patsey Lot, MD  potassium chloride (KLOR-CON) 10 MEQ tablet Take 1 tablet by mouth daily. 07/26/20    [provider]  prednisoLONE acetate (PRED FORTE) 1 % ophthalmic suspension PLACE 1 DROP INTO BOTH EYES 4 TIMES DAILY. 08/19/20   [provider]  predniSONE (DELTASONE) 5 MG tablet Take by mouth. 10/27/18   [provider]  simvastatin (ZOCOR) 10 MG tablet TAKE 1 TABLET BY MOUTH EVERY DAY AT NIGHT 03/01/17   [provider]  triamcinolone (KENALOG) 0.1 % paste APPLY AFTER MEALS AND AT BEDTIME 11/22/13   [provider]     Allergies: Patient has no known allergies.   Review of Systems   ROS as per HPI  Physical Exam Updated Vital Signs BP (!) 173/60 (BP Location: Right Arm)   Pulse 73   Temp 99 F (37.2 C) (Oral)   Resp 18   Ht 5' 2 (1.575 m)   Wt 58.7 kg   SpO2 97%   BMI 23.65 kg/m  Physical Exam Vitals and nursing note reviewed.  Constitutional:      General: She is not in acute distress.    Appearance: She is well-developed.  HENT:     Head: Normocephalic and atraumatic.  Eyes:     Conjunctiva/sclera: Conjunctivae normal.  Cardiovascular:     Rate and Rhythm: Normal rate and regular rhythm.     Heart sounds: No murmur heard. Pulmonary:     Effort: Pulmonary effort is normal. No respiratory distress.     Breath sounds: Normal breath sounds.  Abdominal:     Palpations: Abdomen is soft.     Tenderness: There is no abdominal tenderness.  Musculoskeletal:        General: No swelling.     Cervical back: Neck supple.  Skin:    General: Skin is warm and dry.     Capillary Refill: Capillary refill takes less than 2 seconds.  Neurological:     General: No focal deficit present.     Mental Status: She is alert. Mental status is at baseline.     Cranial Nerves: No cranial nerve deficit.     Sensory: No sensory deficit.     Gait: Gait normal.  Psychiatric:        Mood and Affect: Mood normal.     ED Course/ Medical Decision Making/ A&P    Procedures Procedures   Medications Ordered in ED Medications  lactated ringers   bolus 500 mL (0 mLs Intravenous Stopped 06/09/24 1045)    Medical Decision Making:   [name] is a 89 y.o. female who presents for episode of abnormal behavior upon awakening this morning as per above.  Physical exam is pertinent for no focal derangement.   The differential includes but is not limited to progression of dementia, UTI, ICH, stroke, dehydration, rhabdomyolysis, metabolic encephalopathy.  Independent historian: Relative: Daughter  External data reviewed: Labs: Reviewed prior labs for baseline, also reviewed prior microbiology, patient with a history of Pseudomonas UTI susceptible to Cipro   Initial Plan:  CT head to evaluate for intracranial pathology in the setting of mental status change Screening labs including CBC and Metabolic panel to evaluate for infectious or metabolic etiology of disease.  Urinalysis with reflex culture ordered to evaluate for UTI or relevant urologic/nephrologic pathology.  CK to evaluate for rhabdomyolysis/dehydration Gentle 500 cc LR bolus EKG to evaluate for cardiac pathology Objective evaluation as below reviewed   Labs: Ordered, Independent interpretation, and Details: CBC with leukocytosis to 11.1 with left shift. Stable anemia, no thrombocytopenia.  UA with presence of LE, negative nitrites, present WBC and bacteria concerning for UTI.  CK wnl.  CMP with creatinine and BUN at baseline, no emergent electrolyte derangement or emergent LFT abnormalities, very mild anion gap elevation nonspecific..  Radiology: Ordered, Independent interpretation, Details: Personally reviewed CT of the head, do not appreciate ICH, displaced fracture, MLS, mass lesion, vasogenic edema, loss of gray/white matter differentiation, and All images reviewed independently.  Agree with radiology report at this time.   CT Head Wo Contrast Result Date: 06/09/2024 EXAM: CT HEAD WITHOUT CONTRAST 06/09/2024 09:24:00 AM TECHNIQUE: CT of the head was performed without the  administration of intravenous contrast. Automated exposure control, iterative reconstruction, and/or weight based adjustment of the mA/kV was utilized to reduce the radiation dose to as low as reasonably achievable. COMPARISON: CT of the head dated 03/23/2024. CLINICAL HISTORY: Mental status change, unknown cause. Daughter states when she went to wake up patient this morning she saw the room wasn't as it was when she put her to sleep, was talking to someone who wasn't there and was acting confused. Hx of UTI w/ similar symptoms. Hx of dementia. FINDINGS: BRAIN AND VENTRICLES: No acute hemorrhage. No evidence of acute infarct. No hydrocephalus. No extra-axial collection. No mass effect or midline shift. There is mild periventricular white matter disease. There is moderate calcification within the carotid siphons. ORBITS: No acute abnormality. The patient is status post bilateral lens replacement. SINUSES: No acute abnormality. SOFT TISSUES AND SKULL: No acute soft tissue abnormality. No skull fracture. IMPRESSION: 1. No acute intracranial abnormality. 2. Mild chronic periventricular white matter disease. Electronically signed by: Evalene Coho MD 06/09/2024 09:30 AM EDT RP Workstation: [id number]    EKG/Medicine tests: Ordered and Independent interpretation EKG Interpretation: Sinus rhythm Abnormal R-wave progression, early transition LVH with secondary repolarization abnormality Confirmed by Rogelia Satterfield ([id number]) on 06/09/2024 11:34:31 AM                Interventions: LR bolus  See the EMR for full details regarding lab and imaging results.  Patient with episode of altered mental status this morning, however now is acting at baseline.  Daughter reports that she has had similar behavior with UTIs, therefore will evaluate with UA.  Additionally daughter expresses concern for dehydration, therefore will obtain metabolic panel, cc LR bolus.  In setting of mental status change, will obtain CT of the head,  as well as screening EKG, though notably patient is without pain or any acute complaint at this time.  Screening workup obtained and reveals evidence of UTI, however no AKI on CKD, no evidence of dehydration.  Mild leukocytosis which is consistent with UTI, however given patient is hemodynamically stable, back to mental status baseline, well-appearing, do feel that it is reasonable for patient to trial course of oral outpatient antibiotics, daughter at bedside is amenable to this plan.  Given history of prior Pseudomonas UTI, will treat with course of Cipro .  Discussed possible tendinopathy or tendon rupture side effects, and daughter expresses understanding.  Will follow-up outpatient with PCP and urology next month.  Presentation is most consistent with acute complicated illness, Current presentation is complicated by underlying chronic conditions, and I did consider and rule out acute life/limb-threatening illness  Discussion of management or test  interpretations with external provider(s): Not indicated  Risk Drugs:Prescription drug management  Disposition: DISCHARGE: I believe that the patient is safe for discharge home with outpatient follow-up. Patient was informed of all pertinent physical exam, laboratory, and imaging findings. Patient's suspected etiology of their symptom presentation was discussed with the patient and all questions were answered. We discussed following up with PCP, outpatient urology. I provided thorough ED return precautions. The patient feels safe and comfortable with this plan.  MDM generated using voice dictation software and may contain dictation errors.  Please contact me for any clarification or with any questions.  Clinical Impression:  1. Cystitis      Discharge   Final Clinical Impression(s) / ED Diagnoses Final diagnoses:  Cystitis    Rx / DC Orders ED Discharge Orders          Ordered    ciprofloxacin  (CIPRO ) 250 MG tablet  Every 12 hours         06/09/24 1020             Rogelia Jerilynn RAMAN, MD 06/09/24 1135

## 2024-06-13 LAB — URINE CULTURE: Culture: 60000 — AB

## 2024-06-14 ENCOUNTER — Telehealth (HOSPITAL_BASED_OUTPATIENT_CLINIC_OR_DEPARTMENT_OTHER): Payer: Self-pay | Admitting: *Deleted

## 2024-06-14 NOTE — Telephone Encounter (Signed)
 Post ED Visit - Positive Culture Follow-up  Culture report reviewed by antimicrobial stewardship pharmacist: [facility] []  Rankin Dee, Pharm.D. [x]  Venetia Gully, Pharm.D., BCPS AQ-ID []  Garrel Crews, Pharm.D., BCPS []  Almarie Lunger, Pharm.D., BCPS []  Lee, [place].D., BCPS, AAHIVP []  Rosaline Bihari, Pharm.D., BCPS, AAHIVP []  Vernell Meier, PharmD, BCPS []  Latanya Hint, PharmD, BCPS []  Donald Medley, PharmD, BCPS []  Rocky Bold, PharmD []  Dorothyann Alert, PharmD, BCPS []  Morene Babe, PharmD  [facility] []  Rosaline Edison, PharmD []  Romona Bliss, PharmD []  Dolphus Roller, PharmD []  Veva Seip, Rph []  Vernell Daunt) Leonce, PharmD []  Eva Allis, PharmD []  Rosaline Millet, PharmD []  Iantha Batch, PharmD []  Arvin Gauss, PharmD []  Wanda Hasting, PharmD []  Ronal Rav, PharmD []  Rocky Slade, PharmD []  Bard Jeans, PharmD   Positive urine culture Treated with ciprofloxacin , organism sensitive to the same and no further patient follow-up is required at this time.  Breanna Robinson 06/14/2024, 11:45 AM
